# Patient Record
Sex: Male | Born: 1975 | Race: White | Hispanic: No | Marital: Single | Smoking: Former smoker
Health system: Southern US, Community
[De-identification: ages and names within clinical notes are randomized; demographics above are authoritative.]

## PROBLEM LIST (undated history)

## (undated) DIAGNOSIS — F419 Anxiety disorder, unspecified: Secondary | ICD-10-CM

## (undated) DIAGNOSIS — T07XXXA Unspecified multiple injuries, initial encounter: Secondary | ICD-10-CM

## (undated) DIAGNOSIS — F32A Depression, unspecified: Secondary | ICD-10-CM

## (undated) DIAGNOSIS — G8929 Other chronic pain: Secondary | ICD-10-CM

## (undated) DIAGNOSIS — F1910 Other psychoactive substance abuse, uncomplicated: Secondary | ICD-10-CM

## (undated) HISTORY — PX: ABDOMINAL SURGERY: SHX537

## (undated) HISTORY — PX: FEMUR SURGERY: SHX943

---

## 1998-05-22 ENCOUNTER — Emergency Department (HOSPITAL_COMMUNITY): Admission: EM | Admit: 1998-05-22 | Discharge: 1998-05-22 | Payer: Self-pay | Admitting: Emergency Medicine

## 1998-07-22 ENCOUNTER — Emergency Department (HOSPITAL_COMMUNITY): Admission: EM | Admit: 1998-07-22 | Discharge: 1998-07-22 | Payer: Self-pay | Admitting: Internal Medicine

## 2000-01-04 ENCOUNTER — Emergency Department (HOSPITAL_COMMUNITY): Admission: EM | Admit: 2000-01-04 | Discharge: 2000-01-04 | Payer: Self-pay

## 2000-01-21 ENCOUNTER — Emergency Department (HOSPITAL_COMMUNITY): Admission: EM | Admit: 2000-01-21 | Discharge: 2000-01-21 | Payer: Self-pay | Admitting: Internal Medicine

## 2000-01-21 ENCOUNTER — Encounter: Payer: Self-pay | Admitting: Emergency Medicine

## 2000-06-20 ENCOUNTER — Emergency Department (HOSPITAL_COMMUNITY): Admission: EM | Admit: 2000-06-20 | Discharge: 2000-06-20 | Payer: Self-pay | Admitting: *Deleted

## 2000-09-14 ENCOUNTER — Emergency Department (HOSPITAL_COMMUNITY): Admission: EM | Admit: 2000-09-14 | Discharge: 2000-09-14 | Payer: Self-pay | Admitting: Emergency Medicine

## 2000-09-14 ENCOUNTER — Encounter: Payer: Self-pay | Admitting: Emergency Medicine

## 2003-10-14 ENCOUNTER — Emergency Department (HOSPITAL_COMMUNITY): Admission: EM | Admit: 2003-10-14 | Discharge: 2003-10-14 | Payer: Self-pay | Admitting: Emergency Medicine

## 2004-05-30 ENCOUNTER — Emergency Department (HOSPITAL_COMMUNITY): Admission: EM | Admit: 2004-05-30 | Discharge: 2004-05-30 | Payer: Self-pay | Admitting: Emergency Medicine

## 2004-08-19 ENCOUNTER — Emergency Department (HOSPITAL_COMMUNITY): Admission: EM | Admit: 2004-08-19 | Discharge: 2004-08-19 | Payer: Self-pay | Admitting: *Deleted

## 2015-03-26 ENCOUNTER — Emergency Department (HOSPITAL_BASED_OUTPATIENT_CLINIC_OR_DEPARTMENT_OTHER): Payer: 59

## 2015-03-26 ENCOUNTER — Emergency Department (HOSPITAL_BASED_OUTPATIENT_CLINIC_OR_DEPARTMENT_OTHER)
Admission: EM | Admit: 2015-03-26 | Discharge: 2015-03-26 | Disposition: A | Discharge status: Home / Self Care | Payer: 59 | Attending: Emergency Medicine | Admitting: Emergency Medicine

## 2015-03-26 ENCOUNTER — Encounter (HOSPITAL_BASED_OUTPATIENT_CLINIC_OR_DEPARTMENT_OTHER): Payer: Self-pay | Admitting: *Deleted

## 2015-03-26 DIAGNOSIS — Y998 Other external cause status: Secondary | ICD-10-CM | POA: Insufficient documentation

## 2015-03-26 DIAGNOSIS — Z720 Tobacco use: Secondary | ICD-10-CM | POA: Diagnosis not present

## 2015-03-26 DIAGNOSIS — S8391XA Sprain of unspecified site of right knee, initial encounter: Secondary | ICD-10-CM | POA: Insufficient documentation

## 2015-03-26 DIAGNOSIS — Y9389 Activity, other specified: Secondary | ICD-10-CM | POA: Diagnosis not present

## 2015-03-26 DIAGNOSIS — Y92410 Unspecified street and highway as the place of occurrence of the external cause: Secondary | ICD-10-CM | POA: Insufficient documentation

## 2015-03-26 DIAGNOSIS — S50811A Abrasion of right forearm, initial encounter: Secondary | ICD-10-CM | POA: Diagnosis not present

## 2015-03-26 DIAGNOSIS — S5001XA Contusion of right elbow, initial encounter: Secondary | ICD-10-CM | POA: Insufficient documentation

## 2015-03-26 DIAGNOSIS — S8991XA Unspecified injury of right lower leg, initial encounter: Secondary | ICD-10-CM | POA: Diagnosis present

## 2015-03-26 NOTE — Discharge Instructions (Signed)
Continue local wound care with dressing changes once or twice daily.  Fill the prescriptions for the medications that were prescribed for you yesterday evening.   Abrasion An abrasion is a cut or scrape of the skin. Abrasions do not extend through all layers of the skin and most heal within 10 days. It is important to care for your abrasion properly to prevent infection. CAUSES  Most abrasions are caused by falling on, or gliding across, the ground or other surface. When your skin rubs on something, the outer and inner layer of skin rubs off, causing an abrasion. DIAGNOSIS  Your caregiver will be able to diagnose an abrasion during a physical exam.  TREATMENT  Your treatment depends on how large and deep the abrasion is. Generally, your abrasion will be cleaned with water and a mild soap to remove any dirt or debris. An antibiotic ointment may be put over the abrasion to prevent an infection. A bandage (dressing) may be wrapped around the abrasion to keep it from getting dirty.  You may need a tetanus shot if:  You cannot remember when you had your last tetanus shot.  You have never had a tetanus shot.  The injury broke your skin. If you get a tetanus shot, your arm may swell, get red, and feel warm to the touch. This is common and not a problem. If you need a tetanus shot and you choose not to have one, there is a rare chance of getting tetanus. Sickness from tetanus can be serious.  HOME CARE INSTRUCTIONS   If a dressing was applied, change it at least once a day or as directed by your caregiver. If the bandage sticks, soak it off with warm water.   Wash the area with water and a mild soap to remove all the ointment 2 times a day. Rinse off the soap and pat the area dry with a clean towel.   Reapply any ointment as directed by your caregiver. This will help prevent infection and keep the bandage from sticking. Use gauze over the wound and under the dressing to help keep the bandage  from sticking.   Change your dressing right away if it becomes wet or dirty.   Only take over-the-counter or prescription medicines for pain, discomfort, or fever as directed by your caregiver.   Follow up with your caregiver within 24-48 hours for a wound check, or as directed. If you were not given a wound-check appointment, look closely at your abrasion for redness, swelling, or pus. These are signs of infection. SEEK IMMEDIATE MEDICAL CARE IF:   You have increasing pain in the wound.   You have redness, swelling, or tenderness around the wound.   You have pus coming from the wound.   You have a fever or persistent symptoms for more than 2-3 days.  You have a fever and your symptoms suddenly get worse.  You have a bad smell coming from the wound or dressing.  MAKE SURE YOU:   Understand these instructions.  Will watch your condition.  Will get help right away if you are not doing well or get worse. Document Released: 08/24/2005 Document Revised: 10/31/2012 Document Reviewed: 10/18/2011 [facility] Patient Information 2015 [place], [place]. This information is not intended to replace advice given to you by your health care provider. Make sure you discuss any questions you have with your health care provider.  Knee Sprain A knee sprain is a tear in one of the strong, fibrous tissues that connect the  bones (ligaments) in your knee. The severity of the sprain depends on how much of the ligament is torn. The tear can be either partial or complete. CAUSES  Often, sprains are a result of a fall or injury. The force of the impact causes the fibers of your ligament to stretch too much. This excess tension causes the fibers of your ligament to tear. SIGNS AND SYMPTOMS  You may have some loss of motion in your knee. Other symptoms include:  Bruising.  Pain in the knee area.  Tenderness of the knee to the touch.  Swelling. DIAGNOSIS  To diagnose a knee sprain, your health care  provider will physically examine your knee. Your health care provider may also suggest an X-ray exam of your knee to make sure no bones are broken. TREATMENT  If your ligament is only partially torn, treatment usually involves keeping the knee in a fixed position (immobilization) or bracing your knee for activities that require movement for several weeks. To do this, your health care provider will apply a bandage, cast, or splint to keep your knee from moving and to support your knee during movement until it heals. For a partially torn ligament, the healing process usually takes 4-6 weeks. If your ligament is completely torn, depending on which ligament it is, you may need surgery to reconnect the ligament to the bone or reconstruct it. After surgery, a cast or splint may be applied and will need to stay on your knee for 4-6 weeks while your ligament heals. HOME CARE INSTRUCTIONS  Keep your injured knee elevated to decrease swelling.  To ease pain and swelling, apply ice to the injured area:  Put ice in a plastic bag.  Place a towel between your skin and the bag.  Leave the ice on for 20 minutes, 2-3 times a day.  Only take medicine for pain as directed by your health care provider.  Do not leave your knee unprotected until pain and stiffness go away (usually 4-6 weeks).  If you have a cast or splint, do not allow it to get wet. If you have been instructed not to remove it, cover it with a plastic bag when you shower or bathe. Do not swim.  Your health care provider may suggest exercises for you to do during your recovery to prevent or limit permanent weakness and stiffness. SEEK IMMEDIATE MEDICAL CARE IF:  Your cast or splint becomes damaged.  Your pain becomes worse.  You have significant pain, swelling, or numbness below the cast or splint. MAKE SURE YOU:  Understand these instructions.  Will watch your condition.  Will get help right away if you are not doing well or get  worse. Document Released: 11/14/2005 Document Revised: 09/04/2013 Document Reviewed: 06/26/2013 [facility] Patient Information 2015 [place], [place]. This information is not intended to replace advice given to you by your health care provider. Make sure you discuss any questions you have with your health care provider.
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
## 2015-03-26 NOTE — ED Notes (Signed)
Pt's wounds re-dressed  
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
## 2015-03-26 NOTE — ED Notes (Signed)
Patient transported to X-ray 

## 2015-03-26 NOTE — ED Notes (Signed)
MD at bedside. 

## 2020-05-04 ENCOUNTER — Encounter (HOSPITAL_COMMUNITY)
Admission: EM | Disposition: A | Discharge status: Home / Self Care | Payer: Self-pay | Source: Home / Self Care | Attending: Student

## 2020-05-04 ENCOUNTER — Encounter (HOSPITAL_COMMUNITY): Payer: Self-pay | Admitting: Student in an Organized Health Care Education/Training Program

## 2020-05-04 ENCOUNTER — Emergency Department (HOSPITAL_COMMUNITY): Payer: Self-pay | Admitting: Certified Registered Nurse Anesthetist

## 2020-05-04 ENCOUNTER — Emergency Department (HOSPITAL_COMMUNITY): Payer: Self-pay

## 2020-05-04 ENCOUNTER — Inpatient Hospital Stay (HOSPITAL_COMMUNITY)
Admission: EM | Admit: 2020-05-04 | Discharge: 2020-05-12 | DRG: 480 | Disposition: A | Discharge status: Home / Self Care | Payer: Self-pay | Attending: Student | Admitting: Student

## 2020-05-04 ENCOUNTER — Other Ambulatory Visit: Payer: Self-pay

## 2020-05-04 DIAGNOSIS — L03115 Cellulitis of right lower limb: Secondary | ICD-10-CM | POA: Diagnosis present

## 2020-05-04 DIAGNOSIS — Z419 Encounter for procedure for purposes other than remedying health state, unspecified: Secondary | ICD-10-CM

## 2020-05-04 DIAGNOSIS — F1410 Cocaine abuse, uncomplicated: Secondary | ICD-10-CM | POA: Diagnosis present

## 2020-05-04 DIAGNOSIS — S82871C Displaced pilon fracture of right tibia, initial encounter for open fracture type IIIA, IIIB, or IIIC: Principal | ICD-10-CM | POA: Diagnosis present

## 2020-05-04 DIAGNOSIS — S72461A Displaced supracondylar fracture with intracondylar extension of lower end of right femur, initial encounter for closed fracture: Secondary | ICD-10-CM | POA: Diagnosis present

## 2020-05-04 DIAGNOSIS — S72411K Displaced unspecified condyle fracture of lower end of right femur, subsequent encounter for closed fracture with nonunion: Secondary | ICD-10-CM

## 2020-05-04 DIAGNOSIS — D62 Acute posthemorrhagic anemia: Secondary | ICD-10-CM | POA: Diagnosis not present

## 2020-05-04 DIAGNOSIS — Z9049 Acquired absence of other specified parts of digestive tract: Secondary | ICD-10-CM

## 2020-05-04 DIAGNOSIS — S82301B Unspecified fracture of lower end of right tibia, initial encounter for open fracture type I or II: Secondary | ICD-10-CM

## 2020-05-04 DIAGNOSIS — F329 Major depressive disorder, single episode, unspecified: Secondary | ICD-10-CM | POA: Diagnosis present

## 2020-05-04 DIAGNOSIS — Z87891 Personal history of nicotine dependence: Secondary | ICD-10-CM

## 2020-05-04 DIAGNOSIS — S72421K Displaced fracture of lateral condyle of right femur, subsequent encounter for closed fracture with nonunion: Secondary | ICD-10-CM

## 2020-05-04 DIAGNOSIS — Z23 Encounter for immunization: Secondary | ICD-10-CM

## 2020-05-04 DIAGNOSIS — S0181XA Laceration without foreign body of other part of head, initial encounter: Secondary | ICD-10-CM | POA: Diagnosis present

## 2020-05-04 DIAGNOSIS — G8929 Other chronic pain: Secondary | ICD-10-CM | POA: Diagnosis present

## 2020-05-04 DIAGNOSIS — S82891B Other fracture of right lower leg, initial encounter for open fracture type I or II: Secondary | ICD-10-CM

## 2020-05-04 DIAGNOSIS — Z87820 Personal history of traumatic brain injury: Secondary | ICD-10-CM

## 2020-05-04 DIAGNOSIS — F419 Anxiety disorder, unspecified: Secondary | ICD-10-CM | POA: Diagnosis present

## 2020-05-04 DIAGNOSIS — S82899A Other fracture of unspecified lower leg, initial encounter for closed fracture: Secondary | ICD-10-CM

## 2020-05-04 DIAGNOSIS — T148XXA Other injury of unspecified body region, initial encounter: Secondary | ICD-10-CM

## 2020-05-04 DIAGNOSIS — S72411A Displaced unspecified condyle fracture of lower end of right femur, initial encounter for closed fracture: Secondary | ICD-10-CM

## 2020-05-04 DIAGNOSIS — Y92410 Unspecified street and highway as the place of occurrence of the external cause: Secondary | ICD-10-CM

## 2020-05-04 DIAGNOSIS — Z20822 Contact with and (suspected) exposure to covid-19: Secondary | ICD-10-CM | POA: Diagnosis present

## 2020-05-04 DIAGNOSIS — R440 Auditory hallucinations: Secondary | ICD-10-CM | POA: Diagnosis not present

## 2020-05-04 DIAGNOSIS — M25461 Effusion, right knee: Secondary | ICD-10-CM | POA: Diagnosis present

## 2020-05-04 DIAGNOSIS — D72829 Elevated white blood cell count, unspecified: Secondary | ICD-10-CM

## 2020-05-04 DIAGNOSIS — T1490XA Injury, unspecified, initial encounter: Secondary | ICD-10-CM

## 2020-05-04 DIAGNOSIS — S82831B Other fracture of upper and lower end of right fibula, initial encounter for open fracture type I or II: Secondary | ICD-10-CM

## 2020-05-04 DIAGNOSIS — Z886 Allergy status to analgesic agent status: Secondary | ICD-10-CM

## 2020-05-04 DIAGNOSIS — G9341 Metabolic encephalopathy: Secondary | ICD-10-CM | POA: Diagnosis present

## 2020-05-04 HISTORY — DX: Depression, unspecified: F32.A

## 2020-05-04 HISTORY — PX: ORIF ANKLE FRACTURE: SHX5408

## 2020-05-04 HISTORY — DX: Other psychoactive substance abuse, uncomplicated: F19.10

## 2020-05-04 HISTORY — DX: Unspecified multiple injuries, initial encounter: T07.XXXA

## 2020-05-04 HISTORY — PX: I & D EXTREMITY: SHX5045

## 2020-05-04 HISTORY — DX: Anxiety disorder, unspecified: F41.9

## 2020-05-04 HISTORY — DX: Other chronic pain: G89.29

## 2020-05-04 LAB — COMPREHENSIVE METABOLIC PANEL
ALT: 39 U/L (ref 0–44)
AST: 54 U/L — ABNORMAL HIGH (ref 15–41)
Albumin: 3.3 g/dL — ABNORMAL LOW (ref 3.5–5.0)
Alkaline Phosphatase: 95 U/L (ref 38–126)
Anion gap: 9 (ref 5–15)
BUN: 10 mg/dL (ref 6–20)
CO2: 21 mmol/L — ABNORMAL LOW (ref 22–32)
Calcium: 8.1 mg/dL — ABNORMAL LOW (ref 8.9–10.3)
Chloride: 108 mmol/L (ref 98–111)
Creatinine, Ser: 1 mg/dL (ref 0.61–1.24)
GFR calc Af Amer: >60 mL/min (ref >60)
GFR calc non Af Amer: >60 mL/min (ref >60)
Glucose, Bld: 111 mg/dL — ABNORMAL HIGH (ref 70–99)
Potassium: 3.4 mmol/L — ABNORMAL LOW (ref 3.5–5.1)
Sodium: 138 mmol/L (ref 135–145)
Total Bilirubin: 0.6 mg/dL (ref 0.3–1.2)
Total Protein: 5.8 g/dL — ABNORMAL LOW (ref 6.5–8.1)

## 2020-05-04 LAB — PROTIME-INR
INR: 0.9 (ref 0.8–1.2)
Prothrombin Time: 12 seconds (ref 11.4–15.2)

## 2020-05-04 LAB — SARS CORONAVIRUS 2 BY RT PCR (HOSPITAL ORDER, PERFORMED IN ~~LOC~~ HOSPITAL LAB): SARS Coronavirus 2: NEGATIVE

## 2020-05-04 LAB — CBC
HCT: 35.7 % — ABNORMAL LOW (ref 39.0–52.0)
Hemoglobin: 11.6 g/dL — ABNORMAL LOW (ref 13.0–17.0)
MCH: 32.1 pg (ref 26.0–34.0)
MCHC: 32.5 g/dL (ref 30.0–36.0)
MCV: 98.9 fL (ref 80.0–100.0)
Platelets: 277 10*3/uL (ref 150–400)
RBC: 3.61 MIL/uL — ABNORMAL LOW (ref 4.22–5.81)
RDW: 13.2 % (ref 11.5–15.5)
WBC: 10.8 10*3/uL — ABNORMAL HIGH (ref 4.0–10.5)
nRBC: 0 % (ref 0.0–0.2)

## 2020-05-04 LAB — I-STAT CHEM 8, ED
BUN: 10 mg/dL (ref 6–20)
Calcium, Ion: 1.12 mmol/L — ABNORMAL LOW (ref 1.15–1.40)
Chloride: 105 mmol/L (ref 98–111)
Creatinine, Ser: 1 mg/dL (ref 0.61–1.24)
Glucose, Bld: 109 mg/dL — ABNORMAL HIGH (ref 70–99)
HCT: 33 % — ABNORMAL LOW (ref 39.0–52.0)
Hemoglobin: 11.2 g/dL — ABNORMAL LOW (ref 13.0–17.0)
Potassium: 3.1 mmol/L — ABNORMAL LOW (ref 3.5–5.1)
Sodium: 141 mmol/L (ref 135–145)
TCO2: 22 mmol/L (ref 22–32)

## 2020-05-04 LAB — SAMPLE TO BLOOD BANK

## 2020-05-04 LAB — ETHANOL: Alcohol, Ethyl (B): <10 mg/dL (ref <10)

## 2020-05-04 LAB — LACTIC ACID, PLASMA: Lactic Acid, Venous: 1.5 mmol/L (ref 0.5–1.9)

## 2020-05-04 SURGERY — OPEN REDUCTION INTERNAL FIXATION (ORIF) ANKLE FRACTURE
Anesthesia: General | Site: Leg Lower | Laterality: Right

## 2020-05-04 MED ORDER — FENTANYL CITRATE (PF) 250 MCG/5ML IJ SOLN
INTRAMUSCULAR | Status: AC
  Filled 2020-05-04: qty 5

## 2020-05-04 MED ORDER — CEFAZOLIN SODIUM-DEXTROSE 2-4 GM/100ML-% IV SOLN
INTRAVENOUS | Status: AC
  Filled 2020-05-04: qty 100

## 2020-05-04 MED ORDER — PHENYLEPHRINE 40 MCG/ML (10ML) SYRINGE FOR IV PUSH (FOR BLOOD PRESSURE SUPPORT)
PREFILLED_SYRINGE | INTRAVENOUS | Status: AC
  Filled 2020-05-04: qty 10

## 2020-05-04 MED ORDER — PROPOFOL 10 MG/ML IV BOLUS
INTRAVENOUS | Status: DC | PRN
Start: 2020-05-04 — End: 2020-05-04
  Administered 2020-05-04: 160 mg via INTRAVENOUS

## 2020-05-04 MED ORDER — SODIUM CHLORIDE 0.9 % IR SOLN
Status: DC | PRN
  Administered 2020-05-04 (×3): 3000 mL

## 2020-05-04 MED ORDER — METOCLOPRAMIDE HCL 5 MG/ML IJ SOLN
5.0000 mg | Freq: Once | INTRAMUSCULAR | Status: AC | PRN
  Administered 2020-05-04: 5 mg via INTRAVENOUS

## 2020-05-04 MED ORDER — METOCLOPRAMIDE HCL 5 MG/ML IJ SOLN
INTRAMUSCULAR | Status: AC
  Filled 2020-05-04: qty 2

## 2020-05-04 MED ORDER — SUCCINYLCHOLINE CHLORIDE 200 MG/10ML IV SOSY
PREFILLED_SYRINGE | INTRAVENOUS | Status: AC
  Filled 2020-05-04: qty 10

## 2020-05-04 MED ORDER — IOHEXOL 300 MG/ML  SOLN
100.0000 mL | Freq: Once | INTRAMUSCULAR | Status: AC | PRN
  Administered 2020-05-04: 100 mL via INTRAVENOUS

## 2020-05-04 MED ORDER — HYDROMORPHONE HCL 1 MG/ML IJ SOLN
INTRAMUSCULAR | Status: AC | PRN
  Administered 2020-05-04: 1 mg via INTRAVENOUS

## 2020-05-04 MED ORDER — DEXAMETHASONE SODIUM PHOSPHATE 10 MG/ML IJ SOLN
INTRAMUSCULAR | Status: AC
  Filled 2020-05-04: qty 2

## 2020-05-04 MED ORDER — ROCURONIUM BROMIDE 10 MG/ML (PF) SYRINGE
PREFILLED_SYRINGE | INTRAVENOUS | Status: AC
  Filled 2020-05-04: qty 10

## 2020-05-04 MED ORDER — MIDAZOLAM HCL 2 MG/2ML IJ SOLN
INTRAMUSCULAR | Status: AC
  Filled 2020-05-04: qty 2

## 2020-05-04 MED ORDER — ROCURONIUM 10MG/ML (10ML) SYRINGE FOR MEDFUSION PUMP - OPTIME
INTRAVENOUS | Status: DC | PRN
  Administered 2020-05-04: 10 mg via INTRAVENOUS
  Administered 2020-05-04: 30 mg via INTRAVENOUS

## 2020-05-04 MED ORDER — 0.9 % SODIUM CHLORIDE (POUR BTL) OPTIME
TOPICAL | Status: DC | PRN
  Administered 2020-05-04: 1000 mL

## 2020-05-04 MED ORDER — SODIUM CHLORIDE 0.9 % IV SOLN
INTRAVENOUS | Status: AC | PRN
  Administered 2020-05-04: 999 mL/h via INTRAVENOUS

## 2020-05-04 MED ORDER — EPHEDRINE SULFATE 50 MG/ML IJ SOLN
INTRAMUSCULAR | Status: DC | PRN
Start: 2020-05-04 — End: 2020-05-04
  Administered 2020-05-04: 10 mg via INTRAVENOUS

## 2020-05-04 MED ORDER — LIDOCAINE HCL (CARDIAC) PF 100 MG/5ML IV SOSY
PREFILLED_SYRINGE | INTRAVENOUS | Status: DC | PRN
  Administered 2020-05-04: 80 mg via INTRATRACHEAL

## 2020-05-04 MED ORDER — FENTANYL CITRATE (PF) 100 MCG/2ML IJ SOLN
25.0000 ug | INTRAMUSCULAR | Status: DC | PRN
  Administered 2020-05-04 (×2): 50 ug via INTRAVENOUS

## 2020-05-04 MED ORDER — HYDROMORPHONE HCL 1 MG/ML IJ SOLN
INTRAMUSCULAR | Status: AC
  Filled 2020-05-04: qty 1

## 2020-05-04 MED ORDER — LIDOCAINE 2% (20 MG/ML) 5 ML SYRINGE
INTRAMUSCULAR | Status: AC
  Filled 2020-05-04: qty 5

## 2020-05-04 MED ORDER — ONDANSETRON HCL 4 MG/2ML IJ SOLN
INTRAMUSCULAR | Status: DC | PRN
  Administered 2020-05-04: 4 mg via INTRAVENOUS

## 2020-05-04 MED ORDER — LIDOCAINE-EPINEPHRINE (PF) 2 %-1:200000 IJ SOLN
10.0000 mL | Freq: Once | INTRAMUSCULAR | Status: AC
  Administered 2020-05-04: 10 mL
  Filled 2020-05-04: qty 20

## 2020-05-04 MED ORDER — HYDROMORPHONE HCL 1 MG/ML IJ SOLN
1.0000 mg | Freq: Once | INTRAMUSCULAR | Status: AC
  Administered 2020-05-04: 0.5 mg via INTRAVENOUS
  Filled 2020-05-04: qty 1

## 2020-05-04 MED ORDER — CEFAZOLIN SODIUM-DEXTROSE 2-3 GM-%(50ML) IV SOLR
INTRAVENOUS | Status: DC | PRN
  Administered 2020-05-04: 2 g via INTRAVENOUS

## 2020-05-04 MED ORDER — PROPOFOL 10 MG/ML IV BOLUS
INTRAVENOUS | Status: AC
  Filled 2020-05-04: qty 20

## 2020-05-04 MED ORDER — DEXAMETHASONE SODIUM PHOSPHATE 10 MG/ML IJ SOLN
INTRAMUSCULAR | Status: DC | PRN
  Administered 2020-05-04: 10 mg via INTRAVENOUS

## 2020-05-04 MED ORDER — CEFAZOLIN SODIUM-DEXTROSE 2-4 GM/100ML-% IV SOLN
2.0000 g | Freq: Once | INTRAVENOUS | Status: AC
  Administered 2020-05-04: 2 g via INTRAVENOUS

## 2020-05-04 MED ORDER — ONDANSETRON HCL 4 MG/2ML IJ SOLN
INTRAMUSCULAR | Status: AC
  Filled 2020-05-04: qty 2

## 2020-05-04 MED ORDER — TETANUS-DIPHTH-ACELL PERTUSSIS 5-2.5-18.5 LF-MCG/0.5 IM SUSP
0.5000 mL | Freq: Once | INTRAMUSCULAR | Status: AC
  Administered 2020-05-04: 0.5 mL via INTRAMUSCULAR

## 2020-05-04 MED ORDER — SUCCINYLCHOLINE 20MG/ML (10ML) SYRINGE FOR MEDFUSION PUMP - OPTIME
INTRAMUSCULAR | Status: DC | PRN
  Administered 2020-05-04: 140 mg via INTRAVENOUS

## 2020-05-04 MED ORDER — LACTATED RINGERS IV SOLN
INTRAVENOUS | Status: DC | PRN
Start: 2020-05-04 — End: 2020-05-04

## 2020-05-04 MED ORDER — FENTANYL CITRATE (PF) 250 MCG/5ML IJ SOLN
INTRAMUSCULAR | Status: DC | PRN
  Administered 2020-05-04: 125 ug via INTRAVENOUS
  Administered 2020-05-04: 75 ug via INTRAVENOUS

## 2020-05-04 MED ORDER — MIDAZOLAM HCL 2 MG/2ML IJ SOLN
INTRAMUSCULAR | Status: DC | PRN
  Administered 2020-05-04: 2 mg via INTRAVENOUS

## 2020-05-04 MED ORDER — SUGAMMADEX SODIUM 200 MG/2ML IV SOLN
INTRAVENOUS | Status: DC | PRN
  Administered 2020-05-04: 200 mg via INTRAVENOUS

## 2020-05-04 MED ORDER — FENTANYL CITRATE (PF) 100 MCG/2ML IJ SOLN: 25.0000 ug | INTRAMUSCULAR | Status: DC | PRN

## 2020-05-04 MED ORDER — EPHEDRINE 5 MG/ML INJ
INTRAVENOUS | Status: AC
  Filled 2020-05-04: qty 10

## 2020-05-04 MED ORDER — FENTANYL CITRATE (PF) 100 MCG/2ML IJ SOLN
INTRAMUSCULAR | Status: AC
  Filled 2020-05-04: qty 2

## 2020-05-04 MED ORDER — FENTANYL CITRATE (PF) 100 MCG/2ML IJ SOLN
INTRAMUSCULAR | Status: AC | PRN
  Administered 2020-05-04: 100 ug via INTRAVENOUS

## 2020-05-04 MED ORDER — STERILE WATER FOR IRRIGATION IR SOLN
Status: DC | PRN
  Administered 2020-05-04: 1000 mL

## 2020-05-04 SURGICAL SUPPLY — 78 items
APL SKNCLS STERI-STRIP NONHPOA (GAUZE/BANDAGES/DRESSINGS)
BANDAGE ESMARK 6X9 LF (GAUZE/BANDAGES/DRESSINGS) IMPLANT
BAR EXFX 400X11 NS LF (EXFIX) ×4
BAR GLASS FIBER EXFX 11X400 (EXFIX) ×2 IMPLANT
BENZOIN TINCTURE PRP APPL 2/3 (GAUZE/BANDAGES/DRESSINGS) IMPLANT
BNDG CMPR 9X6 STRL LF SNTH (GAUZE/BANDAGES/DRESSINGS) ×2
BNDG COHESIVE 3X5 TAN STRL LF (GAUZE/BANDAGES/DRESSINGS) ×1 IMPLANT
BNDG ELASTIC 4X5.8 VLCR STR LF (GAUZE/BANDAGES/DRESSINGS) ×3 IMPLANT
BNDG ELASTIC 6X5.8 VLCR STR LF (GAUZE/BANDAGES/DRESSINGS) ×2 IMPLANT
BNDG ESMARK 6X9 LF (GAUZE/BANDAGES/DRESSINGS) ×3
BNDG GAUZE ELAST 4 BULKY (GAUZE/BANDAGES/DRESSINGS) ×1 IMPLANT
BOOTCOVER CLEANROOM LRG (PROTECTIVE WEAR) ×2 IMPLANT
CANISTER SUCT 3000ML PPV (MISCELLANEOUS) ×2 IMPLANT
CLAMP BLUE BAR TO PIN (EXFIX) ×2 IMPLANT
CLSR STERI-STRIP ANTIMIC 1/2X4 (GAUZE/BANDAGES/DRESSINGS) IMPLANT
COVER SURGICAL LIGHT HANDLE (MISCELLANEOUS) ×3 IMPLANT
COVER WAND RF STERILE (DRAPES) IMPLANT
CUFF TOURN SGL QUICK 34 (TOURNIQUET CUFF)
CUFF TOURN SGL QUICK 42 (TOURNIQUET CUFF) IMPLANT
CUFF TRNQT CYL 34X4.125X (TOURNIQUET CUFF) IMPLANT
DECANTER SPIKE VIAL GLASS SM (MISCELLANEOUS) IMPLANT
DRAPE C-ARM 42X72 X-RAY (DRAPES) ×1 IMPLANT
DRAPE C-ARMOR (DRAPES) ×1 IMPLANT
DRAPE INCISE IOBAN 66X45 STRL (DRAPES) IMPLANT
DRAPE OEC MINIVIEW 54X84 (DRAPES) ×2 IMPLANT
DRAPE U-SHAPE 47X51 STRL (DRAPES) ×1 IMPLANT
DRSG PAD ABDOMINAL 8X10 ST (GAUZE/BANDAGES/DRESSINGS) ×3 IMPLANT
DRSG XEROFORM 1X8 (GAUZE/BANDAGES/DRESSINGS) ×2 IMPLANT
DURAPREP 26ML APPLICATOR (WOUND CARE) ×2 IMPLANT
ELECT REM PT RETURN 9FT ADLT (ELECTROSURGICAL) ×3
ELECTRODE REM PT RTRN 9FT ADLT (ELECTROSURGICAL) ×2 IMPLANT
GAUZE SPONGE 4X4 12PLY STRL (GAUZE/BANDAGES/DRESSINGS) ×2 IMPLANT
GAUZE SPONGE 4X4 12PLY STRL LF (GAUZE/BANDAGES/DRESSINGS) ×1 IMPLANT
GAUZE XEROFORM 1X8 LF (GAUZE/BANDAGES/DRESSINGS) ×4 IMPLANT
GLOVE BIOGEL PI ORTHO PRO SZ8 (GLOVE) ×2
GLOVE INDICATOR 7.0 STRL GRN (GLOVE) ×1 IMPLANT
GLOVE ORTHO TXT STRL SZ7.5 (GLOVE) ×5 IMPLANT
GLOVE PI ORTHO PRO STRL SZ8 (GLOVE) ×4 IMPLANT
GLOVE SURG SS PI 6.5 STRL IVOR (GLOVE) ×3 IMPLANT
GLOVE SURG SS PI 7.0 STRL IVOR (GLOVE) ×2 IMPLANT
GOWN STRL REIN XL XLG (GOWN DISPOSABLE) ×2 IMPLANT
GOWN STRL REUS W/ TWL LRG LVL3 (GOWN DISPOSABLE) ×2 IMPLANT
GOWN STRL REUS W/ TWL XL LVL3 (GOWN DISPOSABLE) ×4 IMPLANT
GOWN STRL REUS W/TWL 2XL LVL3 (GOWN DISPOSABLE) ×3 IMPLANT
GOWN STRL REUS W/TWL LRG LVL3 (GOWN DISPOSABLE) ×3
GOWN STRL REUS W/TWL XL LVL3 (GOWN DISPOSABLE) ×6
HALF PIN 5.0X160 (EXFIX) ×2 IMPLANT
KIT BASIN OR (CUSTOM PROCEDURE TRAY) ×3 IMPLANT
KIT TURNOVER KIT B (KITS) ×3 IMPLANT
MANIFOLD NEPTUNE II (INSTRUMENTS) ×3 IMPLANT
NEEDLE HYPO 22GX1.5 SAFETY (NEEDLE) IMPLANT
NS IRRIG 1000ML POUR BTL (IV SOLUTION) ×3 IMPLANT
PACK ORTHO EXTREMITY (CUSTOM PROCEDURE TRAY) ×3 IMPLANT
PAD ABD 8X10 STRL (GAUZE/BANDAGES/DRESSINGS) ×2 IMPLANT
PAD ARMBOARD 7.5X6 YLW CONV (MISCELLANEOUS) ×5 IMPLANT
PAD CAST 4YDX4 CTTN HI CHSV (CAST SUPPLIES) ×4 IMPLANT
PADDING CAST COTTON 4X4 STRL (CAST SUPPLIES)
PIN CLAMP 2BAR 75MM BLUE (EXFIX) ×1 IMPLANT
PIN TRANSFIXING 5.0 (EXFIX) ×1 IMPLANT
SET CYSTO W/LG BORE CLAMP LF (SET/KITS/TRAYS/PACK) ×1 IMPLANT
SOL PREP PROV IODINE SCRUB 4OZ (MISCELLANEOUS) ×1 IMPLANT
SOLUTION BETADINE 4OZ (MISCELLANEOUS) ×1 IMPLANT
SPONGE LAP 4X18 RFD (DISPOSABLE) ×2 IMPLANT
STAPLER VISISTAT 35W (STAPLE) ×1 IMPLANT
SUCTION FRAZIER HANDLE 10FR (MISCELLANEOUS)
SUCTION TUBE FRAZIER 10FR DISP (MISCELLANEOUS) ×2 IMPLANT
SUT MNCRL AB 3-0 PS2 18 (SUTURE) ×2 IMPLANT
SUT VIC AB 0 CT1 27 (SUTURE)
SUT VIC AB 0 CT1 27XBRD ANBCTR (SUTURE) ×2 IMPLANT
SUT VIC AB 3-0 SH 8-18 (SUTURE) ×2 IMPLANT
SYR CONTROL 10ML LL (SYRINGE) IMPLANT
TOWEL GREEN STERILE (TOWEL DISPOSABLE) ×3 IMPLANT
TOWEL GREEN STERILE FF (TOWEL DISPOSABLE) ×3 IMPLANT
TRAY FOLEY MTR SLVR 16FR STAT (SET/KITS/TRAYS/PACK) ×1 IMPLANT
TUBE CONNECTING 12X1/4 (SUCTIONS) ×3 IMPLANT
UNDERPAD 30X36 HEAVY ABSORB (UNDERPADS AND DIAPERS) ×3 IMPLANT
WATER STERILE IRR 1000ML POUR (IV SOLUTION) ×1 IMPLANT
YANKAUER SUCT BULB TIP NO VENT (SUCTIONS) ×1 IMPLANT
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
## 2020-05-04 NOTE — Anesthesia Preprocedure Evaluation (Addendum)
Anesthesia Evaluation  Patient identified by MRN, date of birth, ID band Patient awake  General Assessment Comment:Discussed cervical CT with Dr. Bedelia Person, trama surgeon on call who agreed taht neck collar could be removed for intubation then placed back. Dr. Chilton Si  Reviewed: Allergy & Precautions, NPO status , Patient's Chart, lab work & pertinent test results  Airway Mallampati: II  TM Distance: >3 FB     Dental   Pulmonary neg pulmonary ROS,    Pulmonary exam normal        Cardiovascular negative cardio ROS   Rhythm:Regular Rate:Normal     Neuro/Psych    GI/Hepatic negative GI ROS, Neg liver ROS,   Endo/Other  negative endocrine ROS  Renal/GU negative Renal ROS     Musculoskeletal   Abdominal   Peds  Hematology negative hematology ROS (+)   Anesthesia Other Findings   Reproductive/Obstetrics                            Anesthesia Physical Anesthesia Plan  ASA: II  Anesthesia Plan: General   Post-op Pain Management:    Induction:   PONV Risk Score and Plan: 2 and Ondansetron, Dexamethasone and Midazolam  Airway Management Planned: Oral ETT  Additional Equipment:   Intra-op Plan:   Post-operative Plan: Extubation in OR  Informed Consent: I have reviewed the patients History and Physical, chart, labs and discussed the procedure including the risks, benefits and alternatives for the proposed anesthesia with the patient or authorized representative who has indicated his/her understanding and acceptance.       Plan Discussed with: CRNA and Anesthesiologist  Anesthesia Plan Comments:         Anesthesia Quick Evaluation

## 2020-05-04 NOTE — ED Notes (Signed)
Bernard Daniels, mom, [phone] would like an update when available
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
## 2020-05-04 NOTE — ED Notes (Signed)
Cami, sister, [phone] would like an update when available
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
## 2020-05-04 NOTE — ED Notes (Signed)
Pt to OR w/ RN.  Belongings in bag w/ pt.  One shoe, shirt, underwear, wallet and gold chain.

## 2020-05-04 NOTE — ED Notes (Signed)
Per Pt's request, RN called pt's Mother Bjorn Loser and updated her.  RN gave her instructions to get to the OR waiting room.    Report given to OR RN, they are aware family in coming.

## 2020-05-04 NOTE — Transfer of Care (Signed)
Immediate Anesthesia Transfer of Care Note  Patient: Bernard Daniels  Procedure(s) Performed: EXTERNAL FIXATION OF ANKLE FRACTURE, RIGHT LOWER LEG (Right Ankle) Irrigation And Debridement Extremity, RIGHT (Right Leg Lower)  Patient Location: PACU  Anesthesia Type:General  Level of Consciousness: awake, alert  and oriented  Airway & Oxygen Therapy: Patient connected to nasal cannula oxygen  Post-op Assessment: Report given to RN and Post -op Vital signs reviewed and stable  Post vital signs: Reviewed and stable  Last Vitals:  Vitals Value Taken Time  BP 143/93 05/04/20 2304  Temp    Pulse 78 05/04/20 2308  Resp 14 05/04/20 2308  SpO2 97 % 05/04/20 2308  Vitals shown include unvalidated device data.  Last Pain:  Vitals:   05/04/20 2302  TempSrc:   PainSc: (P) 9          Complications: No apparent anesthesia complications

## 2020-05-04 NOTE — Anesthesia Procedure Notes (Signed)
Procedure Name: Intubation Date/Time: 05/04/2020 9:20 PM Performed by: Molli Hazard, CRNA Pre-anesthesia Checklist: Patient identified, Emergency Drugs available, Suction available and Patient being monitored Patient Re-evaluated:Patient Re-evaluated prior to induction Oxygen Delivery Method: Circle system utilized Preoxygenation: Pre-oxygenation with 100% oxygen Induction Type: IV induction, Rapid sequence and Cricoid Pressure applied Laryngoscope Size: Glidescope Grade View: Grade I Tube type: Oral Tube size: 7.5 mm Number of attempts: 1 Airway Equipment and Method: Stylet Placement Confirmation: ETT inserted through vocal cords under direct vision,  positive ETCO2 and breath sounds checked- equal and bilateral Secured at: 23 cm Tube secured with: Tape Dental Injury: Teeth and Oropharynx as per pre-operative assessment  Comments: Front of Cervical collar removed for intubation. Head remained neutral. Collar replaced immediately after intubation.

## 2020-05-04 NOTE — Anesthesia Postprocedure Evaluation (Signed)
Anesthesia Post Note  Patient: Bernard Daniels  Procedure(s) Performed: 2.  Application of multiplanar external fixator, right leg 3.  Complex closure, right 4 cm open fracture laceration (Right Ankle) 1.  Right distal tibia and fibula fracture incision, irrigation, excisional debridement, skin, subcutaneous tissue, bone (Right Leg Lower)     Patient location during evaluation: PACU Anesthesia Type: General Level of consciousness: awake Pain management: pain level controlled Vital Signs Assessment: post-procedure vital signs reviewed and stable Respiratory status: spontaneous breathing Cardiovascular status: stable Postop Assessment: no apparent nausea or vomiting Anesthetic complications: no    Last Vitals:  Vitals:   05/04/20 2015 05/04/20 2302  BP: (!) 164/93 (!) 143/93  Pulse: 69   Resp:    Temp:  (!) 36.4 C  SpO2: 97%     Last Pain:  Vitals:   05/04/20 2302  TempSrc:   PainSc: 9                  Angelly Spearing
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
## 2020-05-04 NOTE — ED Notes (Signed)
Provider bedside working on suturing head wounds.

## 2020-05-04 NOTE — ED Notes (Signed)
Bethann Berkshire, sister, [phone] would like an update when available

## 2020-05-05 ENCOUNTER — Inpatient Hospital Stay (HOSPITAL_COMMUNITY): Payer: Self-pay

## 2020-05-05 LAB — BASIC METABOLIC PANEL
Anion gap: 12 (ref 5–15)
BUN: 7 mg/dL (ref 6–20)
CO2: 23 mmol/L (ref 22–32)
Calcium: 8.2 mg/dL — ABNORMAL LOW (ref 8.9–10.3)
Chloride: 103 mmol/L (ref 98–111)
Creatinine, Ser: 0.82 mg/dL (ref 0.61–1.24)
GFR calc Af Amer: >60 mL/min (ref >60)
GFR calc non Af Amer: >60 mL/min (ref >60)
Glucose, Bld: 147 mg/dL — ABNORMAL HIGH (ref 70–99)
Potassium: 4 mmol/L (ref 3.5–5.1)
Sodium: 138 mmol/L (ref 135–145)

## 2020-05-05 LAB — CBC
HCT: 35.2 % — ABNORMAL LOW (ref 39.0–52.0)
Hemoglobin: 11.4 g/dL — ABNORMAL LOW (ref 13.0–17.0)
MCH: 31.6 pg (ref 26.0–34.0)
MCHC: 32.4 g/dL (ref 30.0–36.0)
MCV: 97.5 fL (ref 80.0–100.0)
Platelets: 318 10*3/uL (ref 150–400)
RBC: 3.61 MIL/uL — ABNORMAL LOW (ref 4.22–5.81)
RDW: 13.1 % (ref 11.5–15.5)
WBC: 14.2 10*3/uL — ABNORMAL HIGH (ref 4.0–10.5)
nRBC: 0 % (ref 0.0–0.2)

## 2020-05-05 LAB — SURGICAL PCR SCREEN
MRSA, PCR: NEGATIVE
Staphylococcus aureus: NEGATIVE

## 2020-05-05 MED ORDER — ENOXAPARIN SODIUM 40 MG/0.4ML ~~LOC~~ SOLN: 40.0000 mg | SUBCUTANEOUS | Status: DC

## 2020-05-05 MED ORDER — METOCLOPRAMIDE HCL 5 MG/ML IJ SOLN: 5.0000 mg | Freq: Three times a day (TID) | INTRAMUSCULAR | Status: DC | PRN

## 2020-05-05 MED ORDER — ENSURE PRE-SURGERY PO LIQD
296.0000 mL | Freq: Once | ORAL | Status: AC
  Administered 2020-05-06: 296 mL via ORAL
  Filled 2020-05-05: qty 296

## 2020-05-05 MED ORDER — ACETAMINOPHEN 500 MG PO TABS
1000.0000 mg | ORAL_TABLET | Freq: Four times a day (QID) | ORAL | Status: AC
  Administered 2020-05-05 (×4): 1000 mg via ORAL
  Filled 2020-05-05 (×4): qty 2

## 2020-05-05 MED ORDER — METHOCARBAMOL 500 MG PO TABS
500.0000 mg | ORAL_TABLET | Freq: Four times a day (QID) | ORAL | Status: DC | PRN
  Administered 2020-05-05 – 2020-05-12 (×14): 500 mg via ORAL
  Filled 2020-05-05 (×19): qty 1

## 2020-05-05 MED ORDER — METOCLOPRAMIDE HCL 5 MG PO TABS: 5.0000 mg | ORAL_TABLET | Freq: Three times a day (TID) | ORAL | Status: DC | PRN

## 2020-05-05 MED ORDER — ONDANSETRON HCL 4 MG/2ML IJ SOLN: 4.0000 mg | Freq: Four times a day (QID) | INTRAMUSCULAR | Status: DC | PRN

## 2020-05-05 MED ORDER — DOCUSATE SODIUM 100 MG PO CAPS
100.0000 mg | ORAL_CAPSULE | Freq: Two times a day (BID) | ORAL | Status: DC
  Administered 2020-05-05 – 2020-05-12 (×14): 100 mg via ORAL
  Filled 2020-05-05 (×15): qty 1

## 2020-05-05 MED ORDER — ACETAMINOPHEN 325 MG PO TABS
325.0000 mg | ORAL_TABLET | Freq: Four times a day (QID) | ORAL | Status: DC | PRN
  Administered 2020-05-06 – 2020-05-07 (×2): 650 mg via ORAL
  Filled 2020-05-05 (×2): qty 2

## 2020-05-05 MED ORDER — DIPHENHYDRAMINE HCL 12.5 MG/5ML PO ELIX: 12.5000 mg | ORAL_SOLUTION | ORAL | Status: DC | PRN

## 2020-05-05 MED ORDER — SENNA 8.6 MG PO TABS
1.0000 | ORAL_TABLET | Freq: Two times a day (BID) | ORAL | Status: DC
  Administered 2020-05-05 – 2020-05-12 (×13): 8.6 mg via ORAL
  Filled 2020-05-05 (×14): qty 1

## 2020-05-05 MED ORDER — POVIDONE-IODINE 10 % EX SWAB: 2.0000 "application " | Freq: Once | CUTANEOUS | Status: DC

## 2020-05-05 MED ORDER — POTASSIUM CHLORIDE IN NACL 20-0.45 MEQ/L-% IV SOLN
INTRAVENOUS | Status: DC
  Filled 2020-05-05 (×10): qty 1000

## 2020-05-05 MED ORDER — OXYCODONE HCL 5 MG PO TABS
10.0000 mg | ORAL_TABLET | ORAL | Status: DC | PRN
  Administered 2020-05-05 – 2020-05-12 (×24): 15 mg via ORAL
  Filled 2020-05-05 (×17): qty 3
  Filled 2020-05-05: qty 2
  Filled 2020-05-05 (×7): qty 3

## 2020-05-05 MED ORDER — OXYCODONE HCL 5 MG PO TABS
5.0000 mg | ORAL_TABLET | ORAL | Status: DC | PRN
  Administered 2020-05-05 – 2020-05-12 (×5): 10 mg via ORAL
  Filled 2020-05-05 (×4): qty 2

## 2020-05-05 MED ORDER — HYDROMORPHONE HCL 1 MG/ML IJ SOLN
0.5000 mg | INTRAMUSCULAR | Status: DC | PRN
  Administered 2020-05-05 – 2020-05-08 (×15): 1 mg via INTRAVENOUS
  Filled 2020-05-05 (×15): qty 1

## 2020-05-05 MED ORDER — METHOCARBAMOL 1000 MG/10ML IJ SOLN
500.0000 mg | Freq: Four times a day (QID) | INTRAVENOUS | Status: DC | PRN
  Filled 2020-05-05: qty 5

## 2020-05-05 MED ORDER — ZOLPIDEM TARTRATE 5 MG PO TABS
5.0000 mg | ORAL_TABLET | Freq: Every evening | ORAL | Status: DC | PRN
  Administered 2020-05-08 – 2020-05-09 (×2): 5 mg via ORAL
  Filled 2020-05-05 (×2): qty 1

## 2020-05-05 MED ORDER — BISACODYL 10 MG RE SUPP: 10.0000 mg | Freq: Every day | RECTAL | Status: DC | PRN

## 2020-05-05 MED ORDER — CEFAZOLIN SODIUM-DEXTROSE 2-4 GM/100ML-% IV SOLN
2.0000 g | Freq: Three times a day (TID) | INTRAVENOUS | Status: AC
  Administered 2020-05-05 (×3): 2 g via INTRAVENOUS
  Filled 2020-05-05 (×3): qty 100

## 2020-05-05 MED ORDER — ONDANSETRON HCL 4 MG PO TABS: 4.0000 mg | ORAL_TABLET | Freq: Four times a day (QID) | ORAL | Status: DC | PRN

## 2020-05-05 MED ORDER — POLYETHYLENE GLYCOL 3350 17 G PO PACK: 17.0000 g | PACK | Freq: Every day | ORAL | Status: DC | PRN

## 2020-05-05 MED ORDER — CEFAZOLIN SODIUM-DEXTROSE 2-4 GM/100ML-% IV SOLN
2.0000 g | INTRAVENOUS | Status: AC
  Administered 2020-05-06: 2 g via INTRAVENOUS
  Filled 2020-05-05: qty 100

## 2020-05-05 MED ORDER — MAGNESIUM CITRATE PO SOLN: 1.0000 | Freq: Once | ORAL | Status: DC | PRN

## 2020-05-05 MED ORDER — CHLORHEXIDINE GLUCONATE 4 % EX LIQD
60.0000 mL | Freq: Once | CUTANEOUS | Status: AC
  Administered 2020-05-05: 4 via TOPICAL
  Filled 2020-05-05: qty 60

## 2020-05-05 NOTE — TOC CAGE-AID Note (Signed)
Transition of Care [facility]) - CAGE-AID Screening   Patient Details  Name: Bernard Daniels MRN: [id number] Date of Birth: 06/20/1976  [facility]) CM/SW Contact:    Jimmy Picket, LCSWA Phone Number: 05/05/2020, 1:22 PM   Clinical Narrative:  Pt denied alcohol use and substance use.   CAGE-AID Screening:    Have You Ever Felt You Ought to Cut Down on Your Drinking or Drug Use?: No Have People Annoyed You By Critizing Your Drinking Or Drug Use?: No Have You Felt Bad Or Guilty About Your Drinking Or Drug Use?: No Have You Ever Had a Drink or Used Drugs First Thing In The Morning to STeady Your Nerves or to Get Rid of a Hangover?: No CAGE-AID Score: 0  Substance Abuse Education Offered: No    Denita Lung, Bridget Hartshorn Clinical Social Worker [phone]

## 2020-05-05 NOTE — Plan of Care (Signed)
  Problem: Activity: Goal: Ability to increase mobility will improve Outcome: Progressing   Problem: Pain Management: Goal: Pain level will decrease with appropriate interventions Outcome: Progressing   

## 2020-05-05 NOTE — Consult Note (Signed)
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 tibia. Again noted is a displaced fracture of the medial femoral condyle. The patient is status post prior plate screw fixation of the calcaneus. The hardware appears intact. IMPRESSION: 1. Acute comminuted displaced open fractures of the distal fibula and tibia. 2. Acute displaced fracture of the medial femoral condyle. Electronically Signed   By: Katherine Mantle M.D.   On: 05/04/2020 19:42   DG C-Arm 1-60 Min  Result Date: 05/04/2020 CLINICAL DATA:  Ex fix placement EXAM: DG C-ARM 1-60 MIN FLUOROSCOPY TIME:  Fluoroscopy Time:  47 seconds Number of Acquired Spot Images: 5 COMPARISON:  X-ray earlier in the same day. FINDINGS: The patient has undergone external fixation placement. Again noted are highly comminuted fractures of the distal tibia and fibula. There is extensive surrounding soft tissue swelling with pockets of subcutaneous gas. The alignment appears to be somewhat improved since prior study. IMPRESSION: Status post external fixation with improved osseous alignment. Electronically Signed   By: Katherine Mantle M.D.   On: 05/04/2020 22:53    Review of Systems  HENT: Negative for ear discharge, ear pain, hearing loss and tinnitus.   Eyes: Negative for photophobia and pain.  Respiratory: Negative for cough and shortness of breath.   Cardiovascular: Negative for chest pain.  Gastrointestinal: Negative for abdominal pain, nausea and vomiting.  Genitourinary: Negative for dysuria, flank pain, frequency and urgency.  Musculoskeletal: Positive for arthralgias (RLE). Negative for back pain, myalgias and neck pain.  Neurological: Negative for dizziness and headaches.  Hematological: Does not bruise/bleed easily.  Psychiatric/Behavioral: The patient is not nervous/anxious.    Blood pressure 117/88, pulse 87, temperature 97.6 F (36.4 C), temperature source Oral, resp. rate 17, height 5\' 9"  (1.753 m), weight 89.8 kg, SpO2 93 %. Physical Exam  Constitutional: He appears well-developed and well-nourished. No distress.  HENT:  Head: Normocephalic.  Eyes: Conjunctivae are normal. Right eye exhibits no discharge. Left eye exhibits no discharge. No scleral icterus.  Cardiovascular: Normal rate and regular rhythm.  Respiratory: Effort normal. No respiratory distress.  Musculoskeletal:     Cervical back: Normal range of motion.     Comments: RLE External fixator in place  Sens DPN, SPN, TN paresthetic  Motor EHL, ext, flex, evers 5/5  DP 1+, PT 1+, No significant edema  Neurological: He is alert.  Skin: Skin is warm and dry. He is not diaphoretic.  Psychiatric: He has a normal mood and affect. His behavior is normal.    Assessment/Plan: RLE fxs -- Plan repeat I&D, adjustment of ex fix vs ORIF, and ORIF of femoral condyle tomorrow by Dr. . NPO after MN.    Jena Gauss, PA-C Orthopedic Surgery [phone] 05/05/2020, 2:37 PM
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
## 2020-05-05 NOTE — Plan of Care (Signed)
°  Problem: Education: °Goal: Knowledge of the prescribed therapeutic regimen will improve °Outcome: Progressing °  °Problem: Activity: °Goal: Ability to increase mobility will improve °Outcome: Progressing °  °Problem: Physical Regulation: °Goal: Postoperative complications will be avoided or minimized °Outcome: Progressing °  °Problem: Pain Management: °Goal: Pain level will decrease with appropriate interventions °Outcome: Progressing °  °Problem: Skin Integrity: °Goal: Will show signs of wound healing °Outcome: Progressing °  °Problem: Education: °Goal: Knowledge of General Education information will improve °Description: Including pain rating scale, medication(s)/side effects and non-pharmacologic comfort measures °Outcome: Progressing °  °Problem: Health Behavior/Discharge Planning: °Goal: Ability to manage health-related needs will improve °Outcome: Progressing °  °Problem: Clinical Measurements: °Goal: Ability to maintain clinical measurements within normal limits will improve °Outcome: Progressing °Goal: Will remain free from infection °Outcome: Progressing °Goal: Diagnostic test results will improve °Outcome: Progressing °Goal: Respiratory complications will improve °Outcome: Progressing °Goal: Cardiovascular complication will be avoided °Outcome: Progressing °  °Problem: Activity: °Goal: Risk for activity intolerance will decrease °Outcome: Progressing °  °Problem: Nutrition: °Goal: Adequate nutrition will be maintained °Outcome: Progressing °  °Problem: Coping: °Goal: Level of anxiety will decrease °Outcome: Progressing °  °Problem: Elimination: °Goal: Will not experience complications related to bowel motility °Outcome: Progressing °Goal: Will not experience complications related to urinary retention °Outcome: Progressing °  °Problem: Pain Managment: °Goal: General experience of comfort will improve °Outcome: Progressing °  °Problem: Safety: °Goal: Ability to remain free from injury will  improve °Outcome: Progressing °  °Problem: Skin Integrity: °Goal: Risk for impaired skin integrity will decrease °Outcome: Progressing °  °
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
## 2020-05-06 ENCOUNTER — Inpatient Hospital Stay (HOSPITAL_COMMUNITY): Payer: Self-pay

## 2020-05-06 ENCOUNTER — Encounter (HOSPITAL_COMMUNITY)
Admission: EM | Disposition: A | Discharge status: Home / Self Care | Payer: Self-pay | Source: Home / Self Care | Attending: Student

## 2020-05-06 ENCOUNTER — Inpatient Hospital Stay (HOSPITAL_COMMUNITY): Payer: Self-pay | Admitting: Certified Registered Nurse Anesthetist

## 2020-05-06 ENCOUNTER — Encounter (HOSPITAL_COMMUNITY): Payer: Self-pay | Admitting: Orthopedic Surgery

## 2020-05-06 DIAGNOSIS — S72411K Displaced unspecified condyle fracture of lower end of right femur, subsequent encounter for closed fracture with nonunion: Secondary | ICD-10-CM

## 2020-05-06 DIAGNOSIS — S72461A Displaced supracondylar fracture with intracondylar extension of lower end of right femur, initial encounter for closed fracture: Secondary | ICD-10-CM

## 2020-05-06 HISTORY — PX: EXTERNAL FIXATION LEG: SHX1549

## 2020-05-06 HISTORY — PX: ORIF FEMUR FRACTURE: SHX2119

## 2020-05-06 HISTORY — PX: ORIF ANKLE FRACTURE: SHX5408

## 2020-05-06 HISTORY — PX: I & D EXTREMITY: SHX5045

## 2020-05-06 LAB — CBC
HCT: 33.8 % — ABNORMAL LOW (ref 39.0–52.0)
Hemoglobin: 11 g/dL — ABNORMAL LOW (ref 13.0–17.0)
MCH: 31.8 pg (ref 26.0–34.0)
MCHC: 32.5 g/dL (ref 30.0–36.0)
MCV: 97.7 fL (ref 80.0–100.0)
Platelets: 292 10*3/uL (ref 150–400)
RBC: 3.46 MIL/uL — ABNORMAL LOW (ref 4.22–5.81)
RDW: 13.3 % (ref 11.5–15.5)
WBC: 12.6 10*3/uL — ABNORMAL HIGH (ref 4.0–10.5)
nRBC: 0 % (ref 0.0–0.2)

## 2020-05-06 SURGERY — IRRIGATION AND DEBRIDEMENT EXTREMITY
Anesthesia: General | Site: Leg Upper | Laterality: Right

## 2020-05-06 MED ORDER — PROPOFOL 10 MG/ML IV BOLUS
INTRAVENOUS | Status: DC | PRN
  Administered 2020-05-06: 200 mg via INTRAVENOUS

## 2020-05-06 MED ORDER — 0.9 % SODIUM CHLORIDE (POUR BTL) OPTIME
TOPICAL | Status: DC | PRN
  Administered 2020-05-06: 1000 mL

## 2020-05-06 MED ORDER — SUGAMMADEX SODIUM 200 MG/2ML IV SOLN
INTRAVENOUS | Status: DC | PRN
  Administered 2020-05-06: 200 mg via INTRAVENOUS

## 2020-05-06 MED ORDER — ESCITALOPRAM OXALATE 10 MG PO TABS
20.0000 mg | ORAL_TABLET | Freq: Every day | ORAL | Status: DC
  Administered 2020-05-06 – 2020-05-12 (×7): 20 mg via ORAL
  Filled 2020-05-06 (×2): qty 2
  Filled 2020-05-06: qty 1
  Filled 2020-05-06 (×3): qty 2
  Filled 2020-05-06: qty 1
  Filled 2020-05-06 (×2): qty 2
  Filled 2020-05-06: qty 1

## 2020-05-06 MED ORDER — DEXAMETHASONE SODIUM PHOSPHATE 10 MG/ML IJ SOLN
INTRAMUSCULAR | Status: DC | PRN
  Administered 2020-05-06: 4 mg via INTRAVENOUS

## 2020-05-06 MED ORDER — PHENYLEPHRINE HCL-NACL 10-0.9 MG/250ML-% IV SOLN
INTRAVENOUS | Status: DC | PRN
  Administered 2020-05-06: 25 ug/min via INTRAVENOUS

## 2020-05-06 MED ORDER — ONDANSETRON HCL 4 MG/2ML IJ SOLN
INTRAMUSCULAR | Status: DC | PRN
  Administered 2020-05-06: 4 mg via INTRAVENOUS

## 2020-05-06 MED ORDER — ROCURONIUM BROMIDE 10 MG/ML (PF) SYRINGE
PREFILLED_SYRINGE | INTRAVENOUS | Status: AC
  Filled 2020-05-06: qty 10

## 2020-05-06 MED ORDER — HYDROMORPHONE HCL 1 MG/ML IJ SOLN
0.2500 mg | INTRAMUSCULAR | Status: DC | PRN
  Administered 2020-05-06 (×2): 0.5 mg via INTRAVENOUS

## 2020-05-06 MED ORDER — ROCURONIUM BROMIDE 10 MG/ML (PF) SYRINGE
PREFILLED_SYRINGE | INTRAVENOUS | Status: DC | PRN
  Administered 2020-05-06: 20 mg via INTRAVENOUS
  Administered 2020-05-06: 10 mg via INTRAVENOUS
  Administered 2020-05-06 (×3): 20 mg via INTRAVENOUS
  Administered 2020-05-06: 70 mg via INTRAVENOUS

## 2020-05-06 MED ORDER — PHENYLEPHRINE 40 MCG/ML (10ML) SYRINGE FOR IV PUSH (FOR BLOOD PRESSURE SUPPORT)
PREFILLED_SYRINGE | INTRAVENOUS | Status: DC | PRN
  Administered 2020-05-06 (×3): 80 ug via INTRAVENOUS

## 2020-05-06 MED ORDER — MEPERIDINE HCL 25 MG/ML IJ SOLN: 6.2500 mg | INTRAMUSCULAR | Status: DC | PRN

## 2020-05-06 MED ORDER — SCOPOLAMINE 1 MG/3DAYS TD PT72
MEDICATED_PATCH | TRANSDERMAL | Status: DC | PRN
  Administered 2020-05-06: 1 via TRANSDERMAL

## 2020-05-06 MED ORDER — ACETAMINOPHEN 500 MG PO TABS
1000.0000 mg | ORAL_TABLET | Freq: Four times a day (QID) | ORAL | Status: DC
  Administered 2020-05-06 – 2020-05-11 (×19): 1000 mg via ORAL
  Filled 2020-05-06 (×23): qty 2

## 2020-05-06 MED ORDER — VANCOMYCIN HCL 1000 MG IV SOLR
INTRAVENOUS | Status: AC
  Filled 2020-05-06: qty 1000

## 2020-05-06 MED ORDER — TOBRAMYCIN SULFATE 1.2 G IJ SOLR
INTRAMUSCULAR | Status: AC
  Filled 2020-05-06: qty 1.2

## 2020-05-06 MED ORDER — DIAZEPAM 5 MG PO TABS
10.0000 mg | ORAL_TABLET | Freq: Three times a day (TID) | ORAL | Status: DC | PRN
  Administered 2020-05-07 – 2020-05-09 (×4): 10 mg via ORAL
  Filled 2020-05-06 (×4): qty 2

## 2020-05-06 MED ORDER — CHLORHEXIDINE GLUCONATE 0.12 % MT SOLN
15.0000 mL | OROMUCOSAL | Status: AC
  Filled 2020-05-06: qty 15

## 2020-05-06 MED ORDER — SODIUM CHLORIDE 0.9 % IR SOLN
Status: DC | PRN
  Administered 2020-05-06: 3000 mL

## 2020-05-06 MED ORDER — FENTANYL CITRATE (PF) 250 MCG/5ML IJ SOLN
INTRAMUSCULAR | Status: AC
  Filled 2020-05-06: qty 5

## 2020-05-06 MED ORDER — PRAVASTATIN SODIUM 40 MG PO TABS
40.0000 mg | ORAL_TABLET | Freq: Every day | ORAL | Status: DC
  Administered 2020-05-06 – 2020-05-11 (×6): 40 mg via ORAL
  Filled 2020-05-06 (×6): qty 1

## 2020-05-06 MED ORDER — MIDAZOLAM HCL 5 MG/5ML IJ SOLN
INTRAMUSCULAR | Status: DC | PRN
  Administered 2020-05-06: 2 mg via INTRAVENOUS

## 2020-05-06 MED ORDER — MIDAZOLAM HCL 2 MG/2ML IJ SOLN
INTRAMUSCULAR | Status: AC
  Filled 2020-05-06: qty 2

## 2020-05-06 MED ORDER — PROPOFOL 10 MG/ML IV BOLUS
INTRAVENOUS | Status: AC
  Filled 2020-05-06: qty 20

## 2020-05-06 MED ORDER — FENTANYL CITRATE (PF) 250 MCG/5ML IJ SOLN
INTRAMUSCULAR | Status: DC | PRN
  Administered 2020-05-06: 50 ug via INTRAVENOUS
  Administered 2020-05-06: 100 ug via INTRAVENOUS
  Administered 2020-05-06 (×2): 50 ug via INTRAVENOUS

## 2020-05-06 MED ORDER — MIDAZOLAM HCL 2 MG/2ML IJ SOLN: 0.5000 mg | Freq: Once | INTRAMUSCULAR | Status: DC | PRN

## 2020-05-06 MED ORDER — GABAPENTIN 100 MG PO CAPS
100.0000 mg | ORAL_CAPSULE | Freq: Three times a day (TID) | ORAL | Status: DC
  Administered 2020-05-06 – 2020-05-12 (×17): 100 mg via ORAL
  Filled 2020-05-06 (×17): qty 1

## 2020-05-06 MED ORDER — LACTATED RINGERS IV SOLN: INTRAVENOUS | Status: DC

## 2020-05-06 MED ORDER — SODIUM CHLORIDE 0.9 % IV SOLN
2.0000 g | INTRAVENOUS | Status: DC
  Administered 2020-05-06 – 2020-05-07 (×2): 2 g via INTRAVENOUS
  Filled 2020-05-06 (×2): qty 20

## 2020-05-06 MED ORDER — DEXMEDETOMIDINE HCL 200 MCG/2ML IV SOLN
INTRAVENOUS | Status: DC | PRN
  Administered 2020-05-06: 8 ug via INTRAVENOUS
  Administered 2020-05-06: 12 ug via INTRAVENOUS
  Administered 2020-05-06: 8 ug via INTRAVENOUS
  Administered 2020-05-06: 12 ug via INTRAVENOUS

## 2020-05-06 MED ORDER — HYDROMORPHONE HCL 1 MG/ML IJ SOLN
INTRAMUSCULAR | Status: AC
  Filled 2020-05-06: qty 1

## 2020-05-06 MED ORDER — HYDROMORPHONE HCL 1 MG/ML IJ SOLN
INTRAMUSCULAR | Status: AC
  Filled 2020-05-06: qty 0.5

## 2020-05-06 MED ORDER — ENOXAPARIN SODIUM 40 MG/0.4ML ~~LOC~~ SOLN
40.0000 mg | SUBCUTANEOUS | Status: DC
  Administered 2020-05-07 – 2020-05-09 (×2): 40 mg via SUBCUTANEOUS
  Filled 2020-05-06 (×2): qty 0.4

## 2020-05-06 MED ORDER — LIDOCAINE 2% (20 MG/ML) 5 ML SYRINGE
INTRAMUSCULAR | Status: DC | PRN
  Administered 2020-05-06: 20 mg via INTRAVENOUS

## 2020-05-06 MED ORDER — VANCOMYCIN HCL 1000 MG IV SOLR
INTRAVENOUS | Status: DC | PRN
  Administered 2020-05-06 (×2): 1000 mg via TOPICAL

## 2020-05-06 MED ORDER — HYDROMORPHONE HCL 1 MG/ML IJ SOLN
INTRAMUSCULAR | Status: DC | PRN
  Administered 2020-05-06: .5 mg via INTRAVENOUS

## 2020-05-06 MED ORDER — CEFAZOLIN SODIUM-DEXTROSE 2-4 GM/100ML-% IV SOLN
INTRAVENOUS | Status: AC
  Filled 2020-05-06: qty 100

## 2020-05-06 MED ORDER — PHENYLEPHRINE 40 MCG/ML (10ML) SYRINGE FOR IV PUSH (FOR BLOOD PRESSURE SUPPORT)
PREFILLED_SYRINGE | INTRAVENOUS | Status: AC
  Filled 2020-05-06: qty 10

## 2020-05-06 MED ORDER — CHLORHEXIDINE GLUCONATE 0.12 % MT SOLN
OROMUCOSAL | Status: AC
  Administered 2020-05-06: 15 mL via OROMUCOSAL
  Filled 2020-05-06: qty 15

## 2020-05-06 MED ORDER — LIDOCAINE 2% (20 MG/ML) 5 ML SYRINGE
INTRAMUSCULAR | Status: AC
  Filled 2020-05-06: qty 5

## 2020-05-06 MED ORDER — TOBRAMYCIN SULFATE 1.2 G IJ SOLR
INTRAMUSCULAR | Status: DC | PRN
  Administered 2020-05-06: 1.2 g via TOPICAL

## 2020-05-06 MED ORDER — PROMETHAZINE HCL 25 MG/ML IJ SOLN: 6.2500 mg | INTRAMUSCULAR | Status: DC | PRN

## 2020-05-06 SURGICAL SUPPLY — 104 items
APL PRP STRL LF DISP 70% ISPRP (MISCELLANEOUS) ×4
BAR EXFX 150X11 NS LF (EXFIX) ×6
BAR GLASS FIBER EXFX 11X150 (EXFIX) ×10 IMPLANT
BIT DRILL 3.2 XTRAFIX BLUE (BIT) ×5 IMPLANT
BIT DRILL QC 2.5MM SHRT EVO SM (DRILL) ×3 IMPLANT
BLADE CLIPPER SURG (BLADE) ×5 IMPLANT
BNDG CMPR MED 10X6 ELC LF (GAUZE/BANDAGES/DRESSINGS)
BNDG COHESIVE 4X5 TAN STRL (GAUZE/BANDAGES/DRESSINGS) ×5 IMPLANT
BNDG COHESIVE 6X5 TAN STRL LF (GAUZE/BANDAGES/DRESSINGS) IMPLANT
BNDG ELASTIC 4X5.8 VLCR STR LF (GAUZE/BANDAGES/DRESSINGS) ×5 IMPLANT
BNDG ELASTIC 6X10 VLCR STRL LF (GAUZE/BANDAGES/DRESSINGS) IMPLANT
BNDG ELASTIC 6X5.8 VLCR STR LF (GAUZE/BANDAGES/DRESSINGS) ×5 IMPLANT
BNDG GAUZE ELAST 4 BULKY (GAUZE/BANDAGES/DRESSINGS) ×5 IMPLANT
BRUSH SCRUB EZ PLAIN DRY (MISCELLANEOUS) ×10 IMPLANT
CANISTER SUCT 3000ML PPV (MISCELLANEOUS) IMPLANT
CHLORAPREP W/TINT 26 (MISCELLANEOUS) ×10 IMPLANT
CLAMP BLUE BAR TO BAR (EXFIX) ×10 IMPLANT
CLAMP BLUE BAR TO PIN (EXFIX) ×10 IMPLANT
CLOSURE WOUND 1/2 X4 (GAUZE/BANDAGES/DRESSINGS)
COVER MAYO STAND STRL (DRAPES) ×5 IMPLANT
COVER SURGICAL LIGHT HANDLE (MISCELLANEOUS) ×5 IMPLANT
COVER WAND RF STERILE (DRAPES) IMPLANT
DRAPE C-ARM 42X72 X-RAY (DRAPES) ×5 IMPLANT
DRAPE C-ARMOR (DRAPES) ×5 IMPLANT
DRAPE HALF SHEET 40X57 (DRAPES) IMPLANT
DRAPE IMP U-DRAPE 54X76 (DRAPES) ×10 IMPLANT
DRAPE ORTHO SPLIT 77X108 STRL (DRAPES) ×10
DRAPE SURG 17X23 STRL (DRAPES) ×5 IMPLANT
DRAPE SURG ORHT 6 SPLT 77X108 (DRAPES) ×6 IMPLANT
DRAPE U-SHAPE 47X51 STRL (DRAPES) ×5 IMPLANT
DRILL QC 2.5MM SHORT EVOS SM (DRILL) ×5
DRSG ADAPTIC 3X8 NADH LF (GAUZE/BANDAGES/DRESSINGS) IMPLANT
DRSG MEPILEX BORDER 4X12 (GAUZE/BANDAGES/DRESSINGS) IMPLANT
DRSG MEPILEX BORDER 4X4 (GAUZE/BANDAGES/DRESSINGS) IMPLANT
DRSG MEPILEX BORDER 4X8 (GAUZE/BANDAGES/DRESSINGS) IMPLANT
DRSG MEPITEL 4X7.2 (GAUZE/BANDAGES/DRESSINGS) ×5 IMPLANT
DRSG PAD ABDOMINAL 8X10 ST (GAUZE/BANDAGES/DRESSINGS) ×10 IMPLANT
ELECT REM PT RETURN 9FT ADLT (ELECTROSURGICAL) ×5
ELECTRODE REM PT RTRN 9FT ADLT (ELECTROSURGICAL) ×3 IMPLANT
EVACUATOR 1/8 PVC DRAIN (DRAIN) IMPLANT
GAUZE SPONGE 4X4 12PLY STRL (GAUZE/BANDAGES/DRESSINGS) ×5 IMPLANT
GAUZE SPONGE 4X4 12PLY STRL LF (GAUZE/BANDAGES/DRESSINGS) ×5 IMPLANT
GLOVE BIO SURGEON STRL SZ 6.5 (GLOVE) ×12 IMPLANT
GLOVE BIO SURGEON STRL SZ7.5 (GLOVE) ×20 IMPLANT
GLOVE BIO SURGEONS STRL SZ 6.5 (GLOVE) ×3
GLOVE BIOGEL PI IND STRL 6.5 (GLOVE) ×3 IMPLANT
GLOVE BIOGEL PI IND STRL 7.5 (GLOVE) ×3 IMPLANT
GLOVE BIOGEL PI INDICATOR 6.5 (GLOVE) ×2
GLOVE BIOGEL PI INDICATOR 7.5 (GLOVE) ×2
GOWN STRL REUS W/ TWL LRG LVL3 (GOWN DISPOSABLE) ×9 IMPLANT
GOWN STRL REUS W/TWL LRG LVL3 (GOWN DISPOSABLE) ×15
HANDPIECE INTERPULSE COAX TIP (DISPOSABLE) ×5
K-WIRE 2.0 (WIRE) ×8
K-WIRE TROCAR PT 2.0 150MM (WIRE) ×12
KIT BASIN OR (CUSTOM PROCEDURE TRAY) ×5 IMPLANT
KIT PREVENA INCISION MGT 13 (CANNISTER) ×5 IMPLANT
KIT TURNOVER KIT B (KITS) ×5 IMPLANT
KWIRE TROCAR PT 2.0 150MM (WIRE) ×12 IMPLANT
MANIFOLD NEPTUNE II (INSTRUMENTS) ×5 IMPLANT
NEEDLE 22X1 1/2 (OR ONLY) (NEEDLE) IMPLANT
NS IRRIG 1000ML POUR BTL (IV SOLUTION) IMPLANT
PACK ORTHO EXTREMITY (CUSTOM PROCEDURE TRAY) IMPLANT
PACK TOTAL JOINT (CUSTOM PROCEDURE TRAY) ×5 IMPLANT
PAD ARMBOARD 7.5X6 YLW CONV (MISCELLANEOUS) ×5 IMPLANT
PAD CAST 4YDX4 CTTN HI CHSV (CAST SUPPLIES) ×3 IMPLANT
PADDING CAST COTTON 4X4 STRL (CAST SUPPLIES) ×5
PADDING CAST COTTON 6X4 STRL (CAST SUPPLIES) ×5 IMPLANT
PIN 4X100X20MM EXFIX LG BLUNT (EXFIX) ×10 IMPLANT
PLATE EVOS 3.5 RT 115 MED DIST (Plate) ×5 IMPLANT
SCREW CORT 3.5X44MM ST EVOS (Screw) ×5 IMPLANT
SCREW CORT ST EVOS 3.5X55 (Screw) ×5 IMPLANT
SCREW CTX 3.5X38MM EVOS (Screw) ×5 IMPLANT
SCREW LOCK 46X3.5XST STRL (Screw) ×3 IMPLANT
SCREW LOCK EVOS ST 3.5X85 (Screw) ×10 IMPLANT
SCREW LOCK ST 3.5X46 (Screw) ×5 IMPLANT
SCREW LOCK ST EVOS 3.5 X 55 (Screw) ×5 IMPLANT
SCREW LOCK ST EVOS 3.5 X 80 (Screw) ×5 IMPLANT
SCREW LOCKING 3.5X75 (Screw) ×5 IMPLANT
SET HNDPC FAN SPRY TIP SCT (DISPOSABLE) ×3 IMPLANT
SPONGE LAP 18X18 RF (DISPOSABLE) ×5 IMPLANT
STAPLER VISISTAT 35W (STAPLE) IMPLANT
STRIP CLOSURE SKIN 1/2X4 (GAUZE/BANDAGES/DRESSINGS) IMPLANT
SUCTION FRAZIER HANDLE 10FR (MISCELLANEOUS) ×5
SUCTION TUBE FRAZIER 10FR DISP (MISCELLANEOUS) ×3 IMPLANT
SUT ETHILON 2 0 FS 18 (SUTURE) ×10 IMPLANT
SUT ETHILON 3 0 PS 1 (SUTURE) ×20 IMPLANT
SUT MON AB 2-0 CT1 36 (SUTURE) ×5 IMPLANT
SUT PDS AB 0 CT 36 (SUTURE) IMPLANT
SUT PROLENE 0 CT (SUTURE) IMPLANT
SUT VIC AB 0 CT1 27 (SUTURE)
SUT VIC AB 0 CT1 27XBRD ANBCTR (SUTURE) IMPLANT
SUT VIC AB 1 CT1 27 (SUTURE)
SUT VIC AB 1 CT1 27XBRD ANBCTR (SUTURE) IMPLANT
SUT VIC AB 2-0 CT1 27 (SUTURE) ×20
SUT VIC AB 2-0 CT1 TAPERPNT 27 (SUTURE) ×12 IMPLANT
SWAB CULTURE ESWAB REG 1ML (MISCELLANEOUS) IMPLANT
TOWEL GREEN STERILE (TOWEL DISPOSABLE) ×10 IMPLANT
TOWEL GREEN STERILE FF (TOWEL DISPOSABLE) ×10 IMPLANT
TRAY FOLEY MTR SLVR 16FR STAT (SET/KITS/TRAYS/PACK) IMPLANT
TUBE CONNECTING 12'X1/4 (SUCTIONS) ×1
TUBE CONNECTING 12X1/4 (SUCTIONS) ×4 IMPLANT
UNDERPAD 30X36 HEAVY ABSORB (UNDERPADS AND DIAPERS) ×5 IMPLANT
WATER STERILE IRR 1000ML POUR (IV SOLUTION) IMPLANT
YANKAUER SUCT BULB TIP NO VENT (SUCTIONS) ×5 IMPLANT

## 2020-05-06 NOTE — Progress Notes (Signed)
Ortho Trauma Progress Note  Patient seen and examined.  Patient has complex constellation of injuries to the right lower extremity.  He has a highly comminuted right pilon fracture with associated distal fibula fracture.  It was treated with irrigation and debridement with external fixation.  We will plan to perform repeat irrigation and debridement today with a likely placement of metatarsal pins and adjustment of the external fixator.  Depending on his swelling we will likely proceed with definitive fixation later this week versus early next week.  In regards to his right distal femur he has a medial femoral condyle fracture with previous antegrade femoral nail.  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  We will plan to perform open reduction internal fixation of the medial femoral condyle with removal of distal interlocking screws with retention of the femoral nail.  I will likely also perform a repair of the previous lateral femoral condyle nonunion.  Risks and benefits were discussed with the patient.  Risks include but not limited to bleeding, infection, malunion, nonunion, hardware failure, hardware irritation, nerve and blood vessel injury, knee stiffness, posttraumatic arthritis, DVT, even possibility anesthetic complications.  Patient agreed to proceed with surgery and consent was obtained.  Roby Lofts, MD [facility] [phone] (office) [email or web address]

## 2020-05-06 NOTE — Transfer of Care (Signed)
Immediate Anesthesia Transfer of Care Note  Patient: Bernard Daniels  Procedure(s) Performed: IRRIGATION AND DEBRIDEMENT EXTREMITY (Right Ankle) OPEN REDUCTION INTERNAL FIXATION (ORIF) DISTAL FEMUR FRACTURE (Right Leg Upper) EXTERNAL FIXATION adjustment with added bars (Right Leg Lower) OPEN REDUCTION INTERNAL FIXATION (ORIF) ANKLE FRACTURE (Right Ankle)  Patient Location: PACU  Anesthesia Type:General  Level of Consciousness: drowsy  Airway & Oxygen Therapy: Patient Spontanous Breathing and Patient connected to nasal cannula oxygen  Post-op Assessment: Report given to RN and Post -op Vital signs reviewed and stable  Post vital signs: Reviewed and stable  Last Vitals:  Vitals Value Taken Time  BP 122/85 05/06/20 1227  Temp    Pulse 77 05/06/20 1230  Resp 23 05/06/20 1230  SpO2 100 % 05/06/20 1230  Vitals shown include unvalidated device data.  Last Pain:  Vitals:   05/06/20 1225  TempSrc:   PainSc: (P) Asleep      Patients Stated Pain Goal: 3 (05/05/20 1654)  Complications: No apparent anesthesia complications

## 2020-05-06 NOTE — Progress Notes (Signed)
Pt down to SS. Cell phone placed in cabinet. Pt aware.

## 2020-05-06 NOTE — Anesthesia Postprocedure Evaluation (Signed)
Anesthesia Post Note  Patient: Bernard Daniels  Procedure(s) Performed: IRRIGATION AND DEBRIDEMENT EXTREMITY (Right Ankle) OPEN REDUCTION INTERNAL FIXATION (ORIF) DISTAL FEMUR FRACTURE (Right Leg Upper) EXTERNAL FIXATION adjustment with added bars (Right Leg Lower) OPEN REDUCTION INTERNAL FIXATION (ORIF) ANKLE FRACTURE (Right Ankle)     Patient location during evaluation: PACU Anesthesia Type: General Level of consciousness: awake and alert, oriented and patient cooperative Pain management: pain level controlled Vital Signs Assessment: post-procedure vital signs reviewed and stable Respiratory status: spontaneous breathing, nonlabored ventilation, respiratory function stable and patient connected to nasal cannula oxygen Cardiovascular status: blood pressure returned to baseline and stable Postop Assessment: no apparent nausea or vomiting Anesthetic complications: no    Last Vitals:  Vitals:   05/06/20 1240 05/06/20 1311  BP:  (!) 127/95  Pulse: 79 73  Resp: 15 16  Temp: 36.8 C 36.8 C  SpO2: 99% 100%    Last Pain:  Vitals:   05/06/20 1446  TempSrc:   PainSc: 7                  Sheela Mcculley,E. Olamae Ferrara

## 2020-05-06 NOTE — Anesthesia Procedure Notes (Signed)
Procedure Name: Intubation Date/Time: 05/06/2020 8:36 AM Performed by: Waynard Edwards, CRNA Pre-anesthesia Checklist: Emergency Drugs available, Suction available, Patient identified and Patient being monitored Patient Re-evaluated:Patient Re-evaluated prior to induction Oxygen Delivery Method: Circle system utilized Preoxygenation: Pre-oxygenation with 100% oxygen Induction Type: IV induction Ventilation: Mask ventilation without difficulty and Oral airway inserted - appropriate to patient size Laryngoscope Size: Hyacinth Meeker and 2 Grade View: Grade I Tube type: Oral Tube size: 7.5 mm Number of attempts: 1 Airway Equipment and Method: Stylet Placement Confirmation: ETT inserted through vocal cords under direct vision,  positive ETCO2 and breath sounds checked- equal and bilateral Secured at: 23 cm Tube secured with: Tape Dental Injury: Teeth and Oropharynx as per pre-operative assessment
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
## 2020-05-06 NOTE — Progress Notes (Signed)
PT Cancellation Note  Patient Details Name: Bernard Daniels MRN: [id number] DOB: 1976-06-23   Cancelled Treatment:    Reason Eval/Treat Not Completed: (P) Patient at procedure or test/unavailable(Pt off unit for surgical procedure will defer tx at this time.)   Elaysha Bevard Artis Delay 05/06/2020, 11:48 AM  Bonney Leitz , PTA Acute Rehabilitation Services Pager [phone] Office [phone]
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
## 2020-05-07 LAB — BASIC METABOLIC PANEL
Anion gap: 8 (ref 5–15)
BUN: 7 mg/dL (ref 6–20)
CO2: 30 mmol/L (ref 22–32)
Calcium: 8.3 mg/dL — ABNORMAL LOW (ref 8.9–10.3)
Chloride: 102 mmol/L (ref 98–111)
Creatinine, Ser: 0.84 mg/dL (ref 0.61–1.24)
GFR calc Af Amer: >60 mL/min (ref >60)
GFR calc non Af Amer: >60 mL/min (ref >60)
Glucose, Bld: 110 mg/dL — ABNORMAL HIGH (ref 70–99)
Potassium: 3.6 mmol/L (ref 3.5–5.1)
Sodium: 140 mmol/L (ref 135–145)

## 2020-05-07 LAB — CBC
HCT: 30.7 % — ABNORMAL LOW (ref 39.0–52.0)
Hemoglobin: 10 g/dL — ABNORMAL LOW (ref 13.0–17.0)
MCH: 31.9 pg (ref 26.0–34.0)
MCHC: 32.6 g/dL (ref 30.0–36.0)
MCV: 98.1 fL (ref 80.0–100.0)
Platelets: 298 K/uL (ref 150–400)
RBC: 3.13 MIL/uL — ABNORMAL LOW (ref 4.22–5.81)
RDW: 13.2 % (ref 11.5–15.5)
WBC: 13.2 K/uL — ABNORMAL HIGH (ref 4.0–10.5)
nRBC: 0 % (ref 0.0–0.2)

## 2020-05-07 MED ORDER — CEFAZOLIN SODIUM-DEXTROSE 2-4 GM/100ML-% IV SOLN
2.0000 g | INTRAVENOUS | Status: AC
  Administered 2020-05-08: 2 g via INTRAVENOUS
  Filled 2020-05-07: qty 100
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
## 2020-05-07 NOTE — Plan of Care (Signed)
°  Problem: Education: °Goal: Knowledge of the prescribed therapeutic regimen will improve °Outcome: Progressing °  °Problem: Activity: °Goal: Ability to increase mobility will improve °Outcome: Progressing °  °Problem: Physical Regulation: °Goal: Postoperative complications will be avoided or minimized °Outcome: Progressing °  °Problem: Pain Management: °Goal: Pain level will decrease with appropriate interventions °Outcome: Progressing °  °Problem: Skin Integrity: °Goal: Will show signs of wound healing °Outcome: Progressing °  °Problem: Education: °Goal: Knowledge of General Education information will improve °Description: Including pain rating scale, medication(s)/side effects and non-pharmacologic comfort measures °Outcome: Progressing °  °Problem: Health Behavior/Discharge Planning: °Goal: Ability to manage health-related needs will improve °Outcome: Progressing °  °Problem: Clinical Measurements: °Goal: Ability to maintain clinical measurements within normal limits will improve °Outcome: Progressing °Goal: Will remain free from infection °Outcome: Progressing °Goal: Diagnostic test results will improve °Outcome: Progressing °Goal: Respiratory complications will improve °Outcome: Progressing °Goal: Cardiovascular complication will be avoided °Outcome: Progressing °  °Problem: Activity: °Goal: Risk for activity intolerance will decrease °Outcome: Progressing °  °Problem: Nutrition: °Goal: Adequate nutrition will be maintained °Outcome: Progressing °  °Problem: Coping: °Goal: Level of anxiety will decrease °Outcome: Progressing °  °Problem: Elimination: °Goal: Will not experience complications related to bowel motility °Outcome: Progressing °Goal: Will not experience complications related to urinary retention °Outcome: Progressing °  °Problem: Pain Managment: °Goal: General experience of comfort will improve °Outcome: Progressing °  °Problem: Safety: °Goal: Ability to remain free from injury will  improve °Outcome: Progressing °  °Problem: Skin Integrity: °Goal: Risk for impaired skin integrity will decrease °Outcome: Progressing °  °

## 2020-05-07 NOTE — Plan of Care (Signed)
  Problem: Activity: Goal: Ability to increase mobility will improve Outcome: Progressing   Problem: Health Behavior/Discharge Planning: Goal: Ability to manage health-related needs will improve Outcome: Progressing

## 2020-05-08 ENCOUNTER — Inpatient Hospital Stay (HOSPITAL_COMMUNITY): Payer: Self-pay

## 2020-05-08 ENCOUNTER — Inpatient Hospital Stay (HOSPITAL_COMMUNITY): Payer: Self-pay | Admitting: Certified Registered Nurse Anesthetist

## 2020-05-08 ENCOUNTER — Encounter (HOSPITAL_COMMUNITY): Payer: Self-pay | Admitting: Orthopedic Surgery

## 2020-05-08 ENCOUNTER — Encounter (HOSPITAL_COMMUNITY)
Admission: EM | Disposition: A | Discharge status: Home / Self Care | Payer: Self-pay | Source: Home / Self Care | Attending: Student

## 2020-05-08 HISTORY — PX: EXTERNAL FIXATION REMOVAL: SHX5040

## 2020-05-08 HISTORY — PX: OPEN REDUCTION INTERNAL FIXATION (ORIF) TIBIA/FIBULA FRACTURE: SHX5992

## 2020-05-08 LAB — CBC
HCT: 30.6 % — ABNORMAL LOW (ref 39.0–52.0)
Hemoglobin: 10.2 g/dL — ABNORMAL LOW (ref 13.0–17.0)
MCH: 32.6 pg (ref 26.0–34.0)
MCHC: 33.3 g/dL (ref 30.0–36.0)
MCV: 97.8 fL (ref 80.0–100.0)
Platelets: 328 10*3/uL (ref 150–400)
RBC: 3.13 MIL/uL — ABNORMAL LOW (ref 4.22–5.81)
RDW: 13.2 % (ref 11.5–15.5)
WBC: 16.4 10*3/uL — ABNORMAL HIGH (ref 4.0–10.5)
nRBC: 0 % (ref 0.0–0.2)

## 2020-05-08 LAB — BASIC METABOLIC PANEL
Anion gap: 11 (ref 5–15)
BUN: 10 mg/dL (ref 6–20)
CO2: 27 mmol/L (ref 22–32)
Calcium: 8.6 mg/dL — ABNORMAL LOW (ref 8.9–10.3)
Chloride: 99 mmol/L (ref 98–111)
Creatinine, Ser: 0.95 mg/dL (ref 0.61–1.24)
GFR calc Af Amer: >60 mL/min (ref >60)
GFR calc non Af Amer: >60 mL/min (ref >60)
Glucose, Bld: 152 mg/dL — ABNORMAL HIGH (ref 70–99)
Potassium: 4 mmol/L (ref 3.5–5.1)
Sodium: 137 mmol/L (ref 135–145)

## 2020-05-08 SURGERY — OPEN REDUCTION INTERNAL FIXATION (ORIF) TIBIA/FIBULA FRACTURE
Anesthesia: General | Site: Leg Lower | Laterality: Right

## 2020-05-08 MED ORDER — ONDANSETRON HCL 4 MG/2ML IJ SOLN: 4.0000 mg | Freq: Once | INTRAMUSCULAR | Status: DC | PRN

## 2020-05-08 MED ORDER — PROPOFOL 10 MG/ML IV BOLUS
INTRAVENOUS | Status: DC | PRN
  Administered 2020-05-08: 170 mg via INTRAVENOUS

## 2020-05-08 MED ORDER — 0.9 % SODIUM CHLORIDE (POUR BTL) OPTIME
TOPICAL | Status: DC | PRN
  Administered 2020-05-08: 1000 mL

## 2020-05-08 MED ORDER — VANCOMYCIN HCL 1000 MG IV SOLR
INTRAVENOUS | Status: AC
  Filled 2020-05-08: qty 1000

## 2020-05-08 MED ORDER — OXYCODONE HCL 5 MG/5ML PO SOLN: 5.0000 mg | Freq: Once | ORAL | Status: DC | PRN

## 2020-05-08 MED ORDER — VANCOMYCIN HCL 1000 MG IV SOLR
INTRAVENOUS | Status: DC | PRN
  Administered 2020-05-08: 1000 mg via TOPICAL

## 2020-05-08 MED ORDER — ROCURONIUM BROMIDE 10 MG/ML (PF) SYRINGE
PREFILLED_SYRINGE | INTRAVENOUS | Status: DC | PRN
  Administered 2020-05-08: 60 mg via INTRAVENOUS
  Administered 2020-05-08 (×3): 20 mg via INTRAVENOUS

## 2020-05-08 MED ORDER — PROPOFOL 10 MG/ML IV BOLUS
INTRAVENOUS | Status: AC
  Filled 2020-05-08: qty 40

## 2020-05-08 MED ORDER — DEXAMETHASONE SODIUM PHOSPHATE 10 MG/ML IJ SOLN
INTRAMUSCULAR | Status: DC | PRN
  Administered 2020-05-08: 4 mg via INTRAVENOUS

## 2020-05-08 MED ORDER — ONDANSETRON HCL 4 MG/2ML IJ SOLN
INTRAMUSCULAR | Status: DC | PRN
  Administered 2020-05-08: 4 mg via INTRAVENOUS

## 2020-05-08 MED ORDER — HYDROMORPHONE HCL 1 MG/ML IJ SOLN
INTRAMUSCULAR | Status: DC | PRN
  Administered 2020-05-08: .5 mg via INTRAVENOUS

## 2020-05-08 MED ORDER — TOBRAMYCIN SULFATE 1.2 G IJ SOLR
INTRAMUSCULAR | Status: DC | PRN
  Administered 2020-05-08: 1.2 g via TOPICAL

## 2020-05-08 MED ORDER — HYDROMORPHONE HCL 1 MG/ML IJ SOLN
INTRAMUSCULAR | Status: AC
  Filled 2020-05-08: qty 0.5

## 2020-05-08 MED ORDER — HYDROMORPHONE HCL 1 MG/ML IJ SOLN
0.5000 mg | INTRAMUSCULAR | Status: DC | PRN
  Administered 2020-05-08 – 2020-05-11 (×8): 1 mg via INTRAVENOUS
  Filled 2020-05-08 (×8): qty 1

## 2020-05-08 MED ORDER — FENTANYL CITRATE (PF) 250 MCG/5ML IJ SOLN
INTRAMUSCULAR | Status: AC
  Filled 2020-05-08: qty 5

## 2020-05-08 MED ORDER — SODIUM CHLORIDE 0.9 % IV SOLN
2.0000 g | INTRAVENOUS | Status: AC
  Administered 2020-05-08: 2 g via INTRAVENOUS
  Filled 2020-05-08: qty 20

## 2020-05-08 MED ORDER — LIDOCAINE 2% (20 MG/ML) 5 ML SYRINGE
INTRAMUSCULAR | Status: AC
  Filled 2020-05-08: qty 5

## 2020-05-08 MED ORDER — FENTANYL CITRATE (PF) 250 MCG/5ML IJ SOLN
INTRAMUSCULAR | Status: DC | PRN
  Administered 2020-05-08 (×5): 50 ug via INTRAVENOUS

## 2020-05-08 MED ORDER — FENTANYL CITRATE (PF) 100 MCG/2ML IJ SOLN: 25.0000 ug | INTRAMUSCULAR | Status: DC | PRN

## 2020-05-08 MED ORDER — ROCURONIUM BROMIDE 10 MG/ML (PF) SYRINGE
PREFILLED_SYRINGE | INTRAVENOUS | Status: AC
  Filled 2020-05-08: qty 20

## 2020-05-08 MED ORDER — ONDANSETRON HCL 4 MG/2ML IJ SOLN
INTRAMUSCULAR | Status: AC
  Filled 2020-05-08: qty 2

## 2020-05-08 MED ORDER — TOBRAMYCIN SULFATE 1.2 G IJ SOLR
INTRAMUSCULAR | Status: AC
  Filled 2020-05-08: qty 1.2

## 2020-05-08 MED ORDER — BACITRACIN ZINC 500 UNIT/GM EX OINT
TOPICAL_OINTMENT | CUTANEOUS | Status: AC
  Filled 2020-05-08: qty 28.35

## 2020-05-08 MED ORDER — DEXMEDETOMIDINE HCL 200 MCG/2ML IV SOLN
INTRAVENOUS | Status: DC | PRN
  Administered 2020-05-08 (×5): 8 ug via INTRAVENOUS

## 2020-05-08 MED ORDER — LIDOCAINE 2% (20 MG/ML) 5 ML SYRINGE
INTRAMUSCULAR | Status: DC | PRN
  Administered 2020-05-08: 40 mg via INTRAVENOUS

## 2020-05-08 MED ORDER — DEXAMETHASONE SODIUM PHOSPHATE 10 MG/ML IJ SOLN
INTRAMUSCULAR | Status: AC
  Filled 2020-05-08: qty 1

## 2020-05-08 MED ORDER — OXYCODONE HCL 5 MG PO TABS: 5.0000 mg | ORAL_TABLET | Freq: Once | ORAL | Status: DC | PRN

## 2020-05-08 MED ORDER — MIDAZOLAM HCL 2 MG/2ML IJ SOLN
INTRAMUSCULAR | Status: AC
  Filled 2020-05-08: qty 2

## 2020-05-08 MED ORDER — SUGAMMADEX SODIUM 200 MG/2ML IV SOLN
INTRAVENOUS | Status: DC | PRN
  Administered 2020-05-08: 200 mg via INTRAVENOUS

## 2020-05-08 SURGICAL SUPPLY — 78 items
APL PRP STRL LF DISP 70% ISPRP (MISCELLANEOUS) ×1
BANDAGE ESMARK 6X9 LF (GAUZE/BANDAGES/DRESSINGS) ×1 IMPLANT
BIT DRILL 2.5X2.75 QC CALB (BIT) ×3 IMPLANT
BIT DRILL CALIBRATED 2.7 (BIT) ×2 IMPLANT
BIT DRILL CALIBRATED 2.7MM (BIT) ×1
BLADE SURG 10 STRL SS (BLADE) ×3 IMPLANT
BNDG CMPR 9X6 STRL LF SNTH (GAUZE/BANDAGES/DRESSINGS) ×1
BNDG COHESIVE 4X5 TAN STRL (GAUZE/BANDAGES/DRESSINGS) ×3 IMPLANT
BNDG ELASTIC 4X5.8 VLCR STR LF (GAUZE/BANDAGES/DRESSINGS) ×3 IMPLANT
BNDG ELASTIC 6X5.8 VLCR STR LF (GAUZE/BANDAGES/DRESSINGS) ×3 IMPLANT
BNDG ESMARK 6X9 LF (GAUZE/BANDAGES/DRESSINGS) ×3
BRUSH SCRUB EZ PLAIN DRY (MISCELLANEOUS) ×6 IMPLANT
CHLORAPREP W/TINT 26 (MISCELLANEOUS) ×3 IMPLANT
CONNECTOR 5 IN 1 STRAIGHT STRL (MISCELLANEOUS) ×3 IMPLANT
COVER MAYO STAND STRL (DRAPES) ×3 IMPLANT
COVER SURGICAL LIGHT HANDLE (MISCELLANEOUS) ×6 IMPLANT
COVER WAND RF STERILE (DRAPES) ×3 IMPLANT
DRAPE C-ARM 42X72 X-RAY (DRAPES) ×3 IMPLANT
DRAPE C-ARMOR (DRAPES) ×3 IMPLANT
DRAPE INCISE IOBAN 66X45 STRL (DRAPES) ×3 IMPLANT
DRAPE ORTHO SPLIT 77X108 STRL (DRAPES) ×6
DRAPE SURG ORHT 6 SPLT 77X108 (DRAPES) ×2 IMPLANT
DRAPE U-SHAPE 47X51 STRL (DRAPES) ×3 IMPLANT
DRSG EMULSION OIL 3X3 NADH (GAUZE/BANDAGES/DRESSINGS) ×3 IMPLANT
DRSG MEPITEL 4X7.2 (GAUZE/BANDAGES/DRESSINGS) ×3 IMPLANT
DRSG VAC ATS MED SENSATRAC (GAUZE/BANDAGES/DRESSINGS) ×3 IMPLANT
ELECT REM PT RETURN 9FT ADLT (ELECTROSURGICAL) ×3
ELECTRODE REM PT RTRN 9FT ADLT (ELECTROSURGICAL) ×1 IMPLANT
GAUZE SPONGE 4X4 12PLY STRL (GAUZE/BANDAGES/DRESSINGS) ×3 IMPLANT
GLOVE BIO SURGEON STRL SZ 6.5 (GLOVE) ×6 IMPLANT
GLOVE BIO SURGEON STRL SZ7.5 (GLOVE) ×12 IMPLANT
GLOVE BIO SURGEONS STRL SZ 6.5 (GLOVE) ×3
GLOVE BIOGEL PI IND STRL 6.5 (GLOVE) ×1 IMPLANT
GLOVE BIOGEL PI IND STRL 7.5 (GLOVE) ×1 IMPLANT
GLOVE BIOGEL PI INDICATOR 6.5 (GLOVE) ×2
GLOVE BIOGEL PI INDICATOR 7.5 (GLOVE) ×2
GOWN STRL REUS W/ TWL LRG LVL3 (GOWN DISPOSABLE) ×2 IMPLANT
GOWN STRL REUS W/TWL LRG LVL3 (GOWN DISPOSABLE) ×6
K-WIRE ACE 1.6X6 (WIRE) ×9
KIT BASIN OR (CUSTOM PROCEDURE TRAY) ×3 IMPLANT
KIT TURNOVER KIT B (KITS) ×3 IMPLANT
KWIRE ACE 1.6X6 (WIRE) ×3 IMPLANT
MANIFOLD NEPTUNE II (INSTRUMENTS) ×3 IMPLANT
NEEDLE HYPO 21X1.5 SAFETY (NEEDLE) IMPLANT
NS IRRIG 1000ML POUR BTL (IV SOLUTION) ×3 IMPLANT
PACK ORTHO EXTREMITY (CUSTOM PROCEDURE TRAY) ×3 IMPLANT
PAD ARMBOARD 7.5X6 YLW CONV (MISCELLANEOUS) ×6 IMPLANT
PAD CAST 4YDX4 CTTN HI CHSV (CAST SUPPLIES) ×1 IMPLANT
PADDING CAST COTTON 4X4 STRL (CAST SUPPLIES) ×3
PADDING CAST COTTON 6X4 STRL (CAST SUPPLIES) ×3 IMPLANT
PLATE 9H RT DIST ANTLAT TIB (Plate) ×3 IMPLANT
PLATE ANTLAT CNTR NAR 156X9 (Plate) ×1 IMPLANT
SCREW CORTICAL 3.5MM  28MM (Screw) ×6 IMPLANT
SCREW CORTICAL 3.5MM  30MM (Screw) ×3 IMPLANT
SCREW CORTICAL 3.5MM 26MM (Screw) ×3 IMPLANT
SCREW CORTICAL 3.5MM 28MM (Screw) ×2 IMPLANT
SCREW CORTICAL 3.5MM 30MM (Screw) ×1 IMPLANT
SCREW LOCK CORT STAR 3.5X42 (Screw) ×3 IMPLANT
SCREW LOCK CORT STAR 3.5X44 (Screw) ×6 IMPLANT
SCREW LOCK CORT STAR 3.5X46 (Screw) ×3 IMPLANT
SCREW LOCK CORT STAR 3.5X48 (Screw) ×3 IMPLANT
SCREW LOCK THRD ST 3.5X130 (Screw) ×3 IMPLANT
SCREW T15 LP CORT 3.5X48MM NS (Screw) ×3 IMPLANT
SPONGE LAP 18X18 RF (DISPOSABLE) IMPLANT
SUCTION FRAZIER HANDLE 10FR (MISCELLANEOUS) ×3
SUCTION TUBE FRAZIER 10FR DISP (MISCELLANEOUS) ×1 IMPLANT
SUT ETHILON 3 0 PS 1 (SUTURE) ×6 IMPLANT
SUT VIC AB 0 CT1 27 (SUTURE) ×3
SUT VIC AB 0 CT1 27XBRD ANBCTR (SUTURE) ×1 IMPLANT
SUT VIC AB 2-0 CT1 27 (SUTURE) ×3
SUT VIC AB 2-0 CT1 TAPERPNT 27 (SUTURE) ×1 IMPLANT
SYR CONTROL 10ML LL (SYRINGE) IMPLANT
TOWEL GREEN STERILE (TOWEL DISPOSABLE) ×6 IMPLANT
TOWEL GREEN STERILE FF (TOWEL DISPOSABLE) ×3 IMPLANT
TUBE CONNECTING 12'X1/4 (SUCTIONS) ×2
TUBE CONNECTING 12X1/4 (SUCTIONS) ×4 IMPLANT
UNDERPAD 30X36 HEAVY ABSORB (UNDERPADS AND DIAPERS) ×3 IMPLANT
WATER STERILE IRR 1000ML POUR (IV SOLUTION) ×3 IMPLANT
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
## 2020-05-08 NOTE — Progress Notes (Signed)
Orthopedic Tech Progress Note Patient Details:  Bernard Daniels 1976-10-15 [id number] Dropped off CAM [name], MD wants patient to bring with him to [facility] OP APP. And called in order to [employer] for a BIOTECH HINGED KNEE BRACE Ortho Devices Type of Ortho Device: CAM walker Ortho Device/Splint Location: RLU Ortho Device/Splint Interventions: Ordered, Other (comment)   Post Interventions Patient Tolerated: Well Instructions Provided: Care of device   [name] 05/08/2020, 12:10 PM

## 2020-05-08 NOTE — Anesthesia Preprocedure Evaluation (Addendum)
Anesthesia Evaluation  Patient identified by MRN, date of birth, ID band Patient awake    Airway Mallampati: II  TM Distance: >3 FB Neck ROM: Full    Dental  (+) Teeth Intact, Dental Advisory Given   Pulmonary Current Smoker and Patient abstained from smoking.,    breath sounds clear to auscultation       Cardiovascular  Rhythm:Regular Rate:Normal     Neuro/Psych    GI/Hepatic   Endo/Other    Renal/GU      Musculoskeletal   Abdominal   Peds  Hematology   Anesthesia Other Findings   Reproductive/Obstetrics                            Anesthesia Physical Anesthesia Plan  ASA: III  Anesthesia Plan: General   Post-op Pain Management:    Induction: Intravenous  PONV Risk Score and Plan: Ondansetron, Dexamethasone and Treatment may vary due to age or medical condition  Airway Management Planned: Oral ETT  Additional Equipment:   Intra-op Plan:   Post-operative Plan: Extubation in OR  Informed Consent: I have reviewed the patients History and Physical, chart, labs and discussed the procedure including the risks, benefits and alternatives for the proposed anesthesia with the patient or authorized representative who has indicated his/her understanding and acceptance.     Dental advisory given  Plan Discussed with: CRNA and Anesthesiologist  Anesthesia Plan Comments:        Anesthesia Quick Evaluation

## 2020-05-08 NOTE — Progress Notes (Signed)
Ortho Trauma Progress Note  Plan for ORIF of right pilon with removal of external fixator. Risks and benefits discussed and he agrees to proceed. All questions answered.  Roby Lofts, MD [facility] [phone] (office) [email or web address]

## 2020-05-08 NOTE — Progress Notes (Signed)
SLP Cancellation Note  Patient Details Name: Bernard Daniels MRN: [id number] DOB: 05-Apr-1976   Cancelled treatment:       Reason Eval/Treat Not Completed: Medical issues which prohibited therapy. Pt had surgery this morning and per RN, may still be exhibiting side effects from his anesthesia. Recommend holding cognitive evaluation for today. Will f/u as able.    Mahala Menghini., M.A. CCC-SLP Acute Rehabilitation Services Pager [phone] Office [phone]  05/08/2020, 2:54 PM

## 2020-05-08 NOTE — Progress Notes (Signed)
PT Cancellation Note  Patient Details Name: Bernard Daniels MRN: [id number] DOB: [birth year]   Cancelled Treatment:    Reason Eval/Treat Not Completed: Pain limiting ability to participate. Pt underwent surgery earlier today and declines participating with PT at this time. Will continue to follow and progress as able per POC.   Marylynn Pearson 05/08/2020, 2:15 PM   Conni Slipper, PT, DPT Acute Rehabilitation Services Pager: [phone] Office: [phone]

## 2020-05-08 NOTE — Anesthesia Procedure Notes (Signed)
Procedure Name: Intubation Date/Time: 05/08/2020 7:50 AM Performed by: Waynard Edwards, CRNA Pre-anesthesia Checklist: Patient identified, Emergency Drugs available, Patient being monitored and Suction available Patient Re-evaluated:Patient Re-evaluated prior to induction Oxygen Delivery Method: Circle system utilized Preoxygenation: Pre-oxygenation with 100% oxygen Induction Type: IV induction Ventilation: Mask ventilation without difficulty Laryngoscope Size: Miller and 2 Grade View: Grade I Tube type: Oral Tube size: 7.5 mm Number of attempts: 1 Airway Equipment and Method: Stylet Placement Confirmation: ETT inserted through vocal cords under direct vision,  positive ETCO2 and breath sounds checked- equal and bilateral Secured at: 22 cm Tube secured with: Tape Dental Injury: Teeth and Oropharynx as per pre-operative assessment

## 2020-05-08 NOTE — Anesthesia Postprocedure Evaluation (Signed)
Anesthesia Post Note  Patient: VANESSA ALESI  Procedure(s) Performed: OPEN REDUCTION INTERNAL FIXATION RIGHT PILON FRACTURE (Right Leg Lower) REMOVAL OF EXTERNAL FIXATION RIGHT LEG (Right Leg Lower)     Patient location during evaluation: PACU Anesthesia Type: General Level of consciousness: awake and alert Pain management: pain level controlled Vital Signs Assessment: post-procedure vital signs reviewed and stable Respiratory status: spontaneous breathing, nonlabored ventilation, respiratory function stable and patient connected to nasal cannula oxygen Cardiovascular status: blood pressure returned to baseline and stable Postop Assessment: no apparent nausea or vomiting Anesthetic complications: no   No complications documented.  Last Vitals:  Vitals:   05/08/20 1115 05/08/20 1147  BP: 133/86 128/85  Pulse: 85 92  Resp: (!) 29 16  Temp: 37.1 C 37.3 C  SpO2: 94% 94%    Last Pain:  Vitals:   05/08/20 1147  TempSrc: Oral  PainSc:                  Adalynne Steffensmeier COKER

## 2020-05-08 NOTE — Transfer of Care (Signed)
Immediate Anesthesia Transfer of Care Note  Patient: Bernard Daniels  Procedure(s) Performed: OPEN REDUCTION INTERNAL FIXATION RIGHT PILON FRACTURE (Right Leg Lower) REMOVAL OF EXTERNAL FIXATION RIGHT LEG (Right Leg Lower)  Patient Location: PACU  Anesthesia Type:General  Level of Consciousness: alert and cooperative  Airway & Oxygen Therapy: Patient Spontanous Breathing and Patient connected to nasal cannula oxygen  Post-op Assessment: Report given to RN and Post -op Vital signs reviewed and stable  Post vital signs: Reviewed and stable  Last Vitals:  Vitals Value Taken Time  BP 104/83 05/08/20 1038  Temp    Pulse 91 05/08/20 1039  Resp 12 05/08/20 1039  SpO2 97 % 05/08/20 1039  Vitals shown include unvalidated device data.  Last Pain:  Vitals:   05/08/20 0302  TempSrc:   PainSc: Asleep      Patients Stated Pain Goal: 3 (05/07/20 1816)  Complications: No complications documented.

## 2020-05-08 NOTE — Plan of Care (Signed)
  Problem: Education: Goal: Knowledge of the prescribed therapeutic regimen will improve Outcome: Progressing   Problem: Activity: Goal: Ability to increase mobility will improve Outcome: Progressing   Problem: Pain Management: Goal: Pain level will decrease with appropriate interventions Outcome: Progressing   

## 2020-05-09 ENCOUNTER — Inpatient Hospital Stay (HOSPITAL_COMMUNITY): Payer: Self-pay

## 2020-05-09 ENCOUNTER — Encounter (HOSPITAL_COMMUNITY): Payer: Self-pay | Admitting: Orthopedic Surgery

## 2020-05-09 DIAGNOSIS — G9341 Metabolic encephalopathy: Secondary | ICD-10-CM | POA: Diagnosis not present

## 2020-05-09 DIAGNOSIS — F1910 Other psychoactive substance abuse, uncomplicated: Secondary | ICD-10-CM

## 2020-05-09 DIAGNOSIS — D62 Acute posthemorrhagic anemia: Secondary | ICD-10-CM

## 2020-05-09 DIAGNOSIS — F10231 Alcohol dependence with withdrawal delirium: Secondary | ICD-10-CM

## 2020-05-09 DIAGNOSIS — S82871C Displaced pilon fracture of right tibia, initial encounter for open fracture type IIIA, IIIB, or IIIC: Principal | ICD-10-CM

## 2020-05-09 DIAGNOSIS — D72829 Elevated white blood cell count, unspecified: Secondary | ICD-10-CM

## 2020-05-09 LAB — CBC
HCT: 26.4 % — ABNORMAL LOW (ref 39.0–52.0)
Hemoglobin: 8.7 g/dL — ABNORMAL LOW (ref 13.0–17.0)
MCH: 31.8 pg (ref 26.0–34.0)
MCHC: 33 g/dL (ref 30.0–36.0)
MCV: 96.4 fL (ref 80.0–100.0)
Platelets: 315 10*3/uL (ref 150–400)
RBC: 2.74 MIL/uL — ABNORMAL LOW (ref 4.22–5.81)
RDW: 12.9 % (ref 11.5–15.5)
WBC: 12.6 10*3/uL — ABNORMAL HIGH (ref 4.0–10.5)
nRBC: 0 % (ref 0.0–0.2)

## 2020-05-09 LAB — COMPREHENSIVE METABOLIC PANEL
ALT: 64 U/L — ABNORMAL HIGH (ref 0–44)
AST: 89 U/L — ABNORMAL HIGH (ref 15–41)
Albumin: 2.7 g/dL — ABNORMAL LOW (ref 3.5–5.0)
Alkaline Phosphatase: 125 U/L (ref 38–126)
Anion gap: 8 (ref 5–15)
BUN: 8 mg/dL (ref 6–20)
CO2: 26 mmol/L (ref 22–32)
Calcium: 8.3 mg/dL — ABNORMAL LOW (ref 8.9–10.3)
Chloride: 101 mmol/L (ref 98–111)
Creatinine, Ser: 0.83 mg/dL (ref 0.61–1.24)
GFR calc Af Amer: >60 mL/min (ref >60)
GFR calc non Af Amer: >60 mL/min (ref >60)
Glucose, Bld: 125 mg/dL — ABNORMAL HIGH (ref 70–99)
Potassium: 4.2 mmol/L (ref 3.5–5.1)
Sodium: 135 mmol/L (ref 135–145)
Total Bilirubin: 0.5 mg/dL (ref 0.3–1.2)
Total Protein: 6.3 g/dL — ABNORMAL LOW (ref 6.5–8.1)

## 2020-05-09 LAB — AMMONIA: Ammonia: 35 umol/L (ref 9–35)

## 2020-05-09 LAB — PHOSPHORUS: Phosphorus: 2.9 mg/dL (ref 2.5–4.6)

## 2020-05-09 LAB — MAGNESIUM: Magnesium: 1.8 mg/dL (ref 1.7–2.4)

## 2020-05-09 MED ORDER — CHLORDIAZEPOXIDE HCL 25 MG PO CAPS
25.0000 mg | ORAL_CAPSULE | Freq: Three times a day (TID) | ORAL | Status: DC
  Administered 2020-05-09 – 2020-05-11 (×8): 25 mg via ORAL
  Filled 2020-05-09 (×8): qty 1

## 2020-05-09 MED ORDER — LORAZEPAM 2 MG/ML IJ SOLN
1.0000 mg | INTRAMUSCULAR | Status: AC | PRN
  Administered 2020-05-09: 3 mg via INTRAVENOUS
  Administered 2020-05-09: 4 mg via INTRAVENOUS
  Filled 2020-05-09 (×3): qty 2

## 2020-05-09 MED ORDER — LORAZEPAM 1 MG PO TABS
1.0000 mg | ORAL_TABLET | ORAL | Status: AC | PRN
  Administered 2020-05-09: 4 mg via ORAL
  Filled 2020-05-09: qty 4

## 2020-05-09 MED ORDER — LORAZEPAM 2 MG/ML IJ SOLN
INTRAMUSCULAR | Status: AC
  Administered 2020-05-09: 3 mg via INTRAVENOUS
  Filled 2020-05-09: qty 1

## 2020-05-09 MED ORDER — DIPHENHYDRAMINE HCL 25 MG PO CAPS: 25.0000 mg | ORAL_CAPSULE | Freq: Once | ORAL | Status: DC

## 2020-05-09 MED ORDER — CHLORDIAZEPOXIDE HCL 25 MG PO CAPS
50.0000 mg | ORAL_CAPSULE | Freq: Once | ORAL | Status: AC
  Administered 2020-05-09: 50 mg via ORAL
  Filled 2020-05-09: qty 2

## 2020-05-09 MED ORDER — THIAMINE HCL 100 MG PO TABS
100.0000 mg | ORAL_TABLET | Freq: Every day | ORAL | Status: DC
  Administered 2020-05-09 – 2020-05-12 (×4): 100 mg via ORAL
  Filled 2020-05-09 (×4): qty 1

## 2020-05-09 MED ORDER — THIAMINE HCL 100 MG/ML IJ SOLN: 100.0000 mg | Freq: Every day | INTRAMUSCULAR | Status: DC

## 2020-05-09 MED ORDER — DIPHENHYDRAMINE HCL 50 MG/ML IJ SOLN
25.0000 mg | Freq: Once | INTRAMUSCULAR | Status: AC
  Administered 2020-05-09: 25 mg via INTRAVENOUS
  Filled 2020-05-09: qty 1

## 2020-05-09 MED ORDER — ADULT MULTIVITAMIN W/MINERALS CH
1.0000 | ORAL_TABLET | Freq: Every day | ORAL | Status: DC
  Administered 2020-05-09 – 2020-05-12 (×4): 1 via ORAL
  Filled 2020-05-09 (×4): qty 1

## 2020-05-09 MED ORDER — HALOPERIDOL LACTATE 5 MG/ML IJ SOLN
5.0000 mg | Freq: Once | INTRAMUSCULAR | Status: AC
  Administered 2020-05-09: 5 mg via INTRAVENOUS
  Filled 2020-05-09: qty 1

## 2020-05-09 MED ORDER — LORAZEPAM 2 MG/ML IJ SOLN: 0.0000 mg | Freq: Two times a day (BID) | INTRAMUSCULAR | Status: DC

## 2020-05-09 MED ORDER — FOLIC ACID 1 MG PO TABS
1.0000 mg | ORAL_TABLET | Freq: Every day | ORAL | Status: DC
  Administered 2020-05-09 – 2020-05-12 (×4): 1 mg via ORAL
  Filled 2020-05-09 (×4): qty 1

## 2020-05-09 MED ORDER — NICOTINE 21 MG/24HR TD PT24
21.0000 mg | MEDICATED_PATCH | Freq: Every day | TRANSDERMAL | Status: DC
  Administered 2020-05-10 (×2): 21 mg via TRANSDERMAL
  Filled 2020-05-09 (×3): qty 1

## 2020-05-09 MED ORDER — LORAZEPAM 2 MG/ML IJ SOLN
0.0000 mg | Freq: Four times a day (QID) | INTRAMUSCULAR | Status: DC
  Administered 2020-05-09: 3 mg via INTRAVENOUS
  Administered 2020-05-09: 4 mg via INTRAVENOUS
  Administered 2020-05-10: 2 mg via INTRAVENOUS
  Filled 2020-05-09 (×2): qty 1
  Filled 2020-05-09 (×2): qty 2
  Filled 2020-05-09: qty 1
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
## 2020-05-09 NOTE — Progress Notes (Signed)
Patient noted to be standing at bedside, alarm sounding, placed patient back in bed. This occurred times 2. Patient removed IV and tele, refuses to wear. Wants to go home. Disoriented, talking about working, looking for tools. CIWA done and medicated appropriately.

## 2020-05-09 NOTE — Progress Notes (Signed)
Patient just removed 2nd IV. Refuses to let me replace IV , Tele or Sat monitor. He said it is a sucker wrapper, and we are given him Meth in the IV. Looking for his toolbox. Sitter at bedside.
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
## 2020-05-09 NOTE — Plan of Care (Signed)
°  Problem: Education: °Goal: Knowledge of the prescribed therapeutic regimen will improve °Outcome: Progressing °  °Problem: Activity: °Goal: Ability to increase mobility will improve °Outcome: Progressing °  °Problem: Physical Regulation: °Goal: Postoperative complications will be avoided or minimized °Outcome: Progressing °  °Problem: Pain Management: °Goal: Pain level will decrease with appropriate interventions °Outcome: Progressing °  °Problem: Skin Integrity: °Goal: Will show signs of wound healing °Outcome: Progressing °  °Problem: Education: °Goal: Knowledge of General Education information will improve °Description: Including pain rating scale, medication(s)/side effects and non-pharmacologic comfort measures °Outcome: Progressing °  °Problem: Health Behavior/Discharge Planning: °Goal: Ability to manage health-related needs will improve °Outcome: Progressing °  °Problem: Clinical Measurements: °Goal: Ability to maintain clinical measurements within normal limits will improve °Outcome: Progressing °Goal: Will remain free from infection °Outcome: Progressing °Goal: Diagnostic test results will improve °Outcome: Progressing °Goal: Respiratory complications will improve °Outcome: Progressing °Goal: Cardiovascular complication will be avoided °Outcome: Progressing °  °Problem: Activity: °Goal: Risk for activity intolerance will decrease °Outcome: Progressing °  °Problem: Nutrition: °Goal: Adequate nutrition will be maintained °Outcome: Progressing °  °Problem: Coping: °Goal: Level of anxiety will decrease °Outcome: Progressing °  °Problem: Elimination: °Goal: Will not experience complications related to bowel motility °Outcome: Progressing °Goal: Will not experience complications related to urinary retention °Outcome: Progressing °  °Problem: Pain Managment: °Goal: General experience of comfort will improve °Outcome: Progressing °  °Problem: Safety: °Goal: Ability to remain free from injury will  improve °Outcome: Progressing °  °Problem: Skin Integrity: °Goal: Risk for impaired skin integrity will decrease °Outcome: Progressing °  °
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
## 2020-05-09 NOTE — Progress Notes (Signed)
PCCM:  Full consult note to follow.  Librium 50mg  given  Hospitalist called  He will need medical consultation on floor  No ICU beds at the moment Would prefer to hold off on transfer if can He appears stable at this time.  If clinically worsening we will slate for transfer once bed available.  Also discussed with bed placement RN  , DO [facility] 05/09/2020 12:12 PM

## 2020-05-10 ENCOUNTER — Inpatient Hospital Stay (HOSPITAL_COMMUNITY): Payer: Self-pay

## 2020-05-10 ENCOUNTER — Encounter (HOSPITAL_COMMUNITY): Payer: Self-pay | Admitting: Orthopedic Surgery

## 2020-05-10 DIAGNOSIS — M79609 Pain in unspecified limb: Secondary | ICD-10-CM

## 2020-05-10 DIAGNOSIS — M7989 Other specified soft tissue disorders: Secondary | ICD-10-CM

## 2020-05-10 LAB — URINALYSIS, ROUTINE W REFLEX MICROSCOPIC
Bilirubin Urine: NEGATIVE
Glucose, UA: NEGATIVE mg/dL
Hgb urine dipstick: NEGATIVE
Ketones, ur: NEGATIVE mg/dL
Leukocytes,Ua: NEGATIVE
Nitrite: NEGATIVE
Protein, ur: NEGATIVE mg/dL
Specific Gravity, Urine: 1.003 — ABNORMAL LOW (ref 1.005–1.030)
pH: 6 (ref 5.0–8.0)

## 2020-05-10 LAB — CBC
HCT: 28 % — ABNORMAL LOW (ref 39.0–52.0)
Hemoglobin: 9 g/dL — ABNORMAL LOW (ref 13.0–17.0)
MCH: 31.4 pg (ref 26.0–34.0)
MCHC: 32.1 g/dL (ref 30.0–36.0)
MCV: 97.6 fL (ref 80.0–100.0)
Platelets: 387 10*3/uL (ref 150–400)
RBC: 2.87 MIL/uL — ABNORMAL LOW (ref 4.22–5.81)
RDW: 12.6 % (ref 11.5–15.5)
WBC: 11.9 10*3/uL — ABNORMAL HIGH (ref 4.0–10.5)
nRBC: 0 % (ref 0.0–0.2)

## 2020-05-10 LAB — VITAMIN D 25 HYDROXY (VIT D DEFICIENCY, FRACTURES): Vit D, 25-Hydroxy: 24.49 ng/mL — ABNORMAL LOW (ref 30–100)

## 2020-05-10 LAB — RAPID URINE DRUG SCREEN, HOSP PERFORMED
Amphetamines: NOT DETECTED
Barbiturates: NOT DETECTED
Benzodiazepines: POSITIVE — AB
Cocaine: NOT DETECTED
Opiates: NOT DETECTED
Tetrahydrocannabinol: NOT DETECTED

## 2020-05-10 MED ORDER — SODIUM CHLORIDE 0.9 % IV SOLN
1.0000 g | INTRAVENOUS | Status: DC
  Administered 2020-05-10 – 2020-05-11 (×2): 1 g via INTRAVENOUS
  Filled 2020-05-10 (×2): qty 10

## 2020-05-10 NOTE — Progress Notes (Signed)
Pt did not need restraints today.

## 2020-05-10 NOTE — Progress Notes (Signed)
Pt not on restraints at this time.
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
## 2020-05-10 NOTE — Progress Notes (Signed)
05/09/20 - Per day shift, provider had placed orders to transfer pt to ICU.  No ICU beds available - order was cancelled by provider.  No sitter available night shift.   On arrival for night shift, noted mother at the bedside.  Mother stated she would stay if needed and asked to pick up dinner.  At 20:00 (mom not back yet) I responded to bed alarm.  Pt had removed telemetry (placed on standby), removed brace and ace wrap right leg, was out of bed to chair and was attempting to remove his IV.  20:30 APP [place] paged.  Pt status discussed with her.  I reported patient would not keep telemetry on and that telemetry had been placed on standby.  Let provider know patient was trying to remove dressing from rt leg, that patient had removed 5 IVs today (he has had 7 IVs place since admission), will not keep telemetry or pulse ox sensor on.  I expressed my concern that patient was going to fall and let her know that no sitter was available despite pt needing ICU level care.  Reviewed all medications given to the patient during dayshift, current CIWA level, and that patient had not slept since surgery on the 11th.  Orders placed by APP.    2100:  Pt having visual hallucinations.  3 mg IV Ativan given, 15 mg PO Oxy with very little effect. Pt slightly less agitated, still pulling lines and equipment and attempting to get out of the bed.  Mother stated she would stay overnight to assist with patient.    2130:  Pt attempting to get up, remove brace and IV.  Mom at the bedside trying to control patient.  5 mg IV haldol and 25 mg IV benadryl administered.    2150: Pt sleeping.    Pt woke approx 5 am.  Assessed - pt oriented to person time and situation.  To place patient knows he is in the hospital, believes he is in [place].  He reports pain 10/10.  He states he does not remember anything from the last two days.    530 am - gave patient scheduled Ativan, PRN dilaudid, and Tylenol.    630 am - Mother reports  patient again having visual hallucinations, reaching for things that aren't there and pulling at lines.  She believes the behavior is attributable to the ativan.  I let her know he began prohibiting these behaviors prior to giving him ativan and prior to giving him a dose of valium on 6/10.  I did let her know we would leave note for MD.    7 am - handoff to day shift Roni.  No sitter available again for day shift.
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
## 2020-05-10 NOTE — Progress Notes (Signed)
VASCULAR LAB PRELIMINARY  PRELIMINARY  PRELIMINARY  PRELIMINARY  Right lower extremity venous duplex completed.    Preliminary report:  See CV proc for preliminary results.  Jacquis Paxton, RVT 05/10/2020, 4:54 PM

## 2020-05-11 ENCOUNTER — Encounter (HOSPITAL_COMMUNITY): Payer: Self-pay | Admitting: Student

## 2020-05-11 LAB — CBC
HCT: 29 % — ABNORMAL LOW (ref 39.0–52.0)
Hemoglobin: 9.6 g/dL — ABNORMAL LOW (ref 13.0–17.0)
MCH: 32 pg (ref 26.0–34.0)
MCHC: 33.1 g/dL (ref 30.0–36.0)
MCV: 96.7 fL (ref 80.0–100.0)
Platelets: 432 10*3/uL — ABNORMAL HIGH (ref 150–400)
RBC: 3 MIL/uL — ABNORMAL LOW (ref 4.22–5.81)
RDW: 12.8 % (ref 11.5–15.5)
WBC: 12.5 10*3/uL — ABNORMAL HIGH (ref 4.0–10.5)
nRBC: 0.2 % (ref 0.0–0.2)

## 2020-05-11 NOTE — Progress Notes (Signed)
SLP Cancellation Note  Patient Details Name: Bernard Daniels MRN: [id number] DOB: 08/26/1976   Cancelled treatment:       Reason Eval/Treat Not Completed: Patient at procedure or test/unavailable   Mahala Menghini., M.A. CCC-SLP Acute Rehabilitation Services Pager [phone] Office [phone]  05/11/2020, 1:17 PM
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
## 2020-05-11 NOTE — Progress Notes (Signed)
Apparently the patients mother insisted to the day shift nurse that this patient not receive ativan. She claims it makes him hallucinate. Could the MD please clarify whether this patient should be receiving the scheduled doses or not? On call hospitalist notified.
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
## 2020-05-11 NOTE — Plan of Care (Signed)
  Problem: Education: Goal: Knowledge of the prescribed therapeutic regimen will improve Outcome: Progressing   Problem: Activity: Goal: Ability to increase mobility will improve Outcome: Adequate for Discharge
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
## 2020-05-11 NOTE — Plan of Care (Signed)
  Problem: Pain Managment: Goal: General experience of comfort will improve Outcome: Progressing   Problem: Safety: Goal: Ability to remain free from injury will improve Outcome: Progressing   
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
## 2020-05-12 ENCOUNTER — Encounter (HOSPITAL_BASED_OUTPATIENT_CLINIC_OR_DEPARTMENT_OTHER): Payer: Self-pay | Admitting: *Deleted

## 2020-05-12 MED ORDER — CHLORDIAZEPOXIDE HCL 5 MG PO CAPS: 10.0000 mg | ORAL_CAPSULE | Freq: Three times a day (TID) | ORAL | Status: DC

## 2020-05-12 MED ORDER — ENOXAPARIN SODIUM 40 MG/0.4ML ~~LOC~~ SOLN
40.0000 mg | SUBCUTANEOUS | Status: DC
  Administered 2020-05-12: 40 mg via SUBCUTANEOUS
  Filled 2020-05-12: qty 0.4

## 2020-05-12 MED ORDER — CHLORDIAZEPOXIDE HCL 10 MG PO CAPS: 10.0000 mg | ORAL_CAPSULE | Freq: Three times a day (TID) | ORAL | Status: DC

## 2020-05-12 MED ORDER — METHOCARBAMOL 500 MG PO TABS: 500.0000 mg | ORAL_TABLET | Freq: Four times a day (QID) | ORAL | 0 refills | Status: AC | PRN

## 2020-05-12 MED ORDER — OXYCODONE-ACETAMINOPHEN 7.5-325 MG PO TABS: 1.0000 | ORAL_TABLET | ORAL | 0 refills | Status: AC | PRN

## 2020-05-12 MED ORDER — GABAPENTIN 100 MG PO CAPS: 100.0000 mg | ORAL_CAPSULE | Freq: Three times a day (TID) | ORAL | 0 refills | Status: AC

## 2020-05-12 MED ORDER — VITAMIN D 50 MCG (2000 UT) PO CAPS: 2000.0000 [IU] | ORAL_CAPSULE | Freq: Every day | ORAL | 1 refills | Status: AC

## 2020-05-12 MED ORDER — ENOXAPARIN SODIUM 40 MG/0.4ML ~~LOC~~ SOLN: 40.0000 mg | SUBCUTANEOUS | 0 refills | Status: AC

## 2020-05-12 NOTE — Plan of Care (Signed)
  Problem: Pain Management: Goal: Pain level will decrease with appropriate interventions Outcome: Progressing   Problem: Activity: Goal: Ability to increase mobility will improve Outcome: Progressing   

## 2020-05-12 NOTE — Progress Notes (Signed)
D/C instruction given , verbilized understanding. Demonstrated Lovenox injection with education for home injections. Home with mom .
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
## 2020-05-12 NOTE — Discharge Instructions (Signed)
Orthopaedic Trauma Service Discharge Instructions   General Discharge Instructions  WEIGHT BEARING STATUS: Nonweightbearing right lower extremity  RANGE OF MOTION/ACTIVITY: Okay for unrestricted knee motion in the hinged knee brace.  Do not remove splint from right lower leg.  Do not get splint wet.  Wound Care: Keep splint in place until follow-up.  Knee incisions can be left open to air if there is no drainage.  Okay to shower if no drainage from knee incisions.  Cover splint when showering  DVT/PE prophylaxis: Lovenox x30 days  Diet: as you were eating previously.  Can use over the counter stool softeners and bowel preparations, such as Miralax, to help with bowel movements.  Narcotics can be constipating.  Be sure to drink plenty of fluids  PAIN MEDICATION USE AND EXPECTATIONS  You have likely been given narcotic medications to help control your pain.  After a traumatic event that results in an fracture (broken bone) with or without surgery, it is ok to use narcotic pain medications to help control one's pain.  We understand that everyone responds to pain differently and each individual patient will be evaluated on a regular basis for the continued need for narcotic medications. Ideally, narcotic medication use should last no more than 6-8 weeks (coinciding with fracture healing).   As a patient it is your responsibility as well to monitor narcotic medication use and report the amount and frequency you use these medications when you come to your office visit.   We would also advise that if you are using narcotic medications, you should take a dose prior to therapy to maximize you participation.  IF YOU ARE ON NARCOTIC MEDICATIONS IT IS NOT PERMISSIBLE TO OPERATE A MOTOR VEHICLE (MOTORCYCLE/CAR/TRUCK/MOPED) OR HEAVY MACHINERY DO NOT MIX NARCOTICS WITH OTHER CNS (CENTRAL NERVOUS SYSTEM) DEPRESSANTS SUCH AS ALCOHOL   STOP SMOKING OR USING NICOTINE PRODUCTS!!!!  As discussed nicotine  severely impairs your body's ability to heal surgical and traumatic wounds but also impairs bone healing.  Wounds and bone heal by forming microscopic blood vessels (angiogenesis) and nicotine is a vasoconstrictor (essentially, shrinks blood vessels).  Therefore, if vasoconstriction occurs to these microscopic blood vessels they essentially disappear and are unable to deliver necessary nutrients to the healing tissue.  This is one modifiable factor that you can do to dramatically increase your chances of healing your injury.    (This means no smoking, no nicotine gum, patches, etc)  DO NOT USE NONSTEROIDAL ANTI-INFLAMMATORY DRUGS (NSAID'S)  Using products such as Advil (ibuprofen), Aleve (naproxen), Motrin (ibuprofen) for additional pain control during fracture healing can delay and/or prevent the healing response.  If you would like to take over the counter (OTC) medication, Tylenol (acetaminophen) is ok.  However, some narcotic medications that are given for pain control contain acetaminophen as well. Therefore, you should not exceed more than 4000 mg of tylenol in a day if you do not have liver disease.  Also note that there are may OTC medicines, such as cold medicines and allergy medicines that my contain tylenol as well.  If you have any questions about medications and/or interactions please ask your doctor/PA or your pharmacist.      ICE AND ELEVATE INJURED/OPERATIVE EXTREMITY  Using ice and elevating the injured extremity above your heart can help with swelling and pain control.  Icing in a pulsatile fashion, such as 20 minutes on and 20 minutes off, can be followed.    Do not place ice directly on skin. Make sure there  is a barrier between to skin and the ice pack.    Using frozen items such as frozen peas works well as the conform nicely to the are that needs to be iced.  USE AN ACE WRAP OR TED HOSE FOR SWELLING CONTROL  In addition to icing and elevation, Ace wraps or TED hose are used to  help limit and resolve swelling.  It is recommended to use Ace wraps or TED hose until you are informed to stop.    When using Ace Wraps start the wrapping distally (farthest away from the body) and wrap proximally (closer to the body)   Example: If you had surgery on your leg or thing and you do not have a splint on, start the ace wrap at the toes and work your way up to the thigh        If you had surgery on your upper extremity and do not have a splint on, start the ace wrap at your fingers and work your way up to the upper arm  IF YOU ARE IN A SPLINT OR CAST DO NOT [place]   If your splint gets wet for any reason please contact the office immediately. You may shower in your splint or cast as long as you keep it dry.  This can be done by wrapping in a cast cover or garbage back (or similar)  Do Not stick any thing down your splint or cast such as pencils, money, or hangers to try and scratch yourself with.  If you feel itchy take benadryl as prescribed on the bottle for itching  IF YOU ARE IN A CAM BOOT (BLACK BOOT)  You may remove boot periodically. Perform daily dressing changes as noted below.  Wash the liner of the boot regularly and wear a sock when wearing the boot. It is recommended that you sleep in the boot until told otherwise   CALL THE OFFICE WITH ANY QUESTIONS OR CONCERNS: [phone]   VISIT OUR WEBSITE FOR ADDITIONAL INFORMATION: [email or web address]      Discharge Wound Care Instructions  Do NOT apply any ointments, solutions or lotions to pin sites or surgical wounds.  These prevent needed drainage and even though solutions like hydrogen peroxide kill bacteria, they also damage cells lining the pin sites that help fight infection.  Applying lotions or ointments can keep the wounds moist and can cause them to breakdown and open up as well. This can increase the risk for infection. When in doubt call the office.  Surgical incisions should be dressed  daily.  If any drainage is noted, use one layer of adaptic, then gauze, Kerlix, and an ace wrap.  Once the incision is completely dry and without drainage, it may be left open to air out.  Showering may begin 36-48 hours later.  Cleaning gently with soap and water.  Traumatic wounds should be dressed daily as well.    One layer of adaptic, gauze, Kerlix, then ace wrap.  The adaptic can be discontinued once the draining has ceased    If you have a wet to dry dressing: wet the gauze with saline the squeeze as much saline out so the gauze is moist (not soaking wet), place moistened gauze over wound, then place a dry gauze over the moist one, followed by Kerlix wrap, then ace wrap.
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
## 2020-05-12 NOTE — Plan of Care (Signed)
  Problem: Education: Goal: Knowledge of the prescribed therapeutic regimen will improve Outcome: Adequate for Discharge   Problem: Activity: Goal: Ability to increase mobility will improve Outcome: Adequate for Discharge   Problem: Physical Regulation: Goal: Postoperative complications will be avoided or minimized Outcome: Adequate for Discharge   Problem: Pain Management: Goal: Pain level will decrease with appropriate interventions Outcome: Adequate for Discharge   Problem: Skin Integrity: Goal: Will show signs of wound healing Outcome: Adequate for Discharge   Problem: Education: Goal: Knowledge of General Education information will improve Description: Including pain rating scale, medication(s)/side effects and non-pharmacologic comfort measures Outcome: Adequate for Discharge   Problem: Health Behavior/Discharge Planning: Goal: Ability to manage health-related needs will improve Outcome: Adequate for Discharge   Problem: Clinical Measurements: Goal: Ability to maintain clinical measurements within normal limits will improve Outcome: Adequate for Discharge Goal: Will remain free from infection Outcome: Adequate for Discharge Goal: Diagnostic test results will improve Outcome: Adequate for Discharge Goal: Respiratory complications will improve Outcome: Adequate for Discharge Goal: Cardiovascular complication will be avoided Outcome: Adequate for Discharge   Problem: Activity: Goal: Risk for activity intolerance will decrease Outcome: Adequate for Discharge   Problem: Nutrition: Goal: Adequate nutrition will be maintained Outcome: Adequate for Discharge   Problem: Coping: Goal: Level of anxiety will decrease Outcome: Adequate for Discharge   Problem: Elimination: Goal: Will not experience complications related to bowel motility Outcome: Adequate for Discharge Goal: Will not experience complications related to urinary retention Outcome: Adequate for  Discharge   Problem: Pain Managment: Goal: General experience of comfort will improve Outcome: Adequate for Discharge   Problem: Safety: Goal: Ability to remain free from injury will improve Outcome: Adequate for Discharge   Problem: Skin Integrity: Goal: Risk for impaired skin integrity will decrease Outcome: Adequate for Discharge   Problem: Acute Rehab PT Goals(only PT should resolve) Goal: Pt Will Go Supine/Side To Sit Outcome: Adequate for Discharge Goal: Pt Will Go Sit To Supine/Side Outcome: Adequate for Discharge Goal: Patient Will Transfer Sit To/From Stand Outcome: Adequate for Discharge Goal: Pt Will Ambulate Outcome: Adequate for Discharge Goal: Pt Will Go Up/Down Stairs Outcome: Adequate for Discharge   Problem: Acute Rehab OT Goals (only OT should resolve) Goal: Pt. Will Perform Upper Body Bathing Outcome: Adequate for Discharge Goal: Pt. Will Perform Lower Body Bathing Outcome: Adequate for Discharge
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
